# Patient Record
Sex: Female | Born: 1940 | Race: Asian | Hispanic: No | Smoking: Never smoker
Health system: Southern US, Community
[De-identification: ages and names within clinical notes are randomized; demographics above are authoritative.]

## PROBLEM LIST (undated history)

## (undated) DIAGNOSIS — E119 Type 2 diabetes mellitus without complications: Secondary | ICD-10-CM

## (undated) HISTORY — DX: Type 2 diabetes mellitus without complications: E11.9

---

## 2006-11-05 ENCOUNTER — Encounter: Admission: RE | Admit: 2006-11-05 | Discharge: 2006-11-05 | Payer: Self-pay | Admitting: Obstetrics and Gynecology

## 2008-12-03 ENCOUNTER — Emergency Department (HOSPITAL_COMMUNITY): Admission: EM | Admit: 2008-12-03 | Discharge: 2008-12-03 | Payer: Self-pay | Admitting: Family Medicine

## 2009-05-31 ENCOUNTER — Emergency Department (HOSPITAL_COMMUNITY): Admission: EM | Admit: 2009-05-31 | Discharge: 2009-05-31 | Payer: Self-pay | Admitting: Family Medicine

## 2010-05-21 ENCOUNTER — Ambulatory Visit: Payer: Self-pay | Admitting: Internal Medicine

## 2010-05-21 ENCOUNTER — Other Ambulatory Visit: Admission: RE | Admit: 2010-05-21 | Discharge: 2010-05-21 | Payer: Self-pay | Admitting: Internal Medicine

## 2010-09-01 ENCOUNTER — Encounter: Payer: Self-pay | Admitting: Internal Medicine

## 2010-11-15 LAB — POCT I-STAT, CHEM 8
BUN: 12 mg/dL (ref 6–23)
Calcium, Ion: 1.2 mmol/L (ref 1.12–1.32)
Chloride: 105 mEq/L (ref 96–112)
Glucose, Bld: 111 mg/dL — ABNORMAL HIGH (ref 70–99)
HCT: 45 % (ref 36.0–46.0)
Potassium: 3.8 mEq/L (ref 3.5–5.1)

## 2010-11-15 LAB — DIFFERENTIAL
Basophils Absolute: 0 10*3/uL (ref 0.0–0.1)
Basophils Relative: 0 % (ref 0–1)
Lymphocytes Relative: 33 % (ref 12–46)
Monocytes Absolute: 0.5 10*3/uL (ref 0.1–1.0)
Monocytes Relative: 8 % (ref 3–12)
Neutro Abs: 3.9 10*3/uL (ref 1.7–7.7)
Neutrophils Relative %: 57 % (ref 43–77)

## 2010-11-15 LAB — POCT URINALYSIS DIP (DEVICE)
Bilirubin Urine: NEGATIVE
Ketones, ur: NEGATIVE mg/dL
Protein, ur: NEGATIVE mg/dL
Specific Gravity, Urine: 1.01 (ref 1.005–1.030)
pH: 7 (ref 5.0–8.0)

## 2010-11-15 LAB — CBC
HCT: 41.5 % (ref 36.0–46.0)
Hemoglobin: 13.9 g/dL (ref 12.0–15.0)
MCV: 86.6 fL (ref 78.0–100.0)
Platelets: 238 10*3/uL (ref 150–400)
WBC: 6.7 10*3/uL (ref 4.0–10.5)

## 2010-11-21 LAB — POCT URINALYSIS DIP (DEVICE)
Glucose, UA: NEGATIVE mg/dL
Ketones, ur: NEGATIVE mg/dL
Specific Gravity, Urine: 1.02 (ref 1.005–1.030)
Urobilinogen, UA: 0.2 mg/dL (ref 0.0–1.0)

## 2014-04-12 ENCOUNTER — Ambulatory Visit (INDEPENDENT_AMBULATORY_CARE_PROVIDER_SITE_OTHER): Payer: Medicare Other | Admitting: Internal Medicine

## 2014-04-12 ENCOUNTER — Other Ambulatory Visit: Payer: Self-pay | Admitting: Internal Medicine

## 2014-04-12 ENCOUNTER — Encounter: Payer: Self-pay | Admitting: Internal Medicine

## 2014-04-12 VITALS — BP 120/80 | HR 88 | Temp 98.8°F | Ht 60.0 in | Wt 130.0 lb

## 2014-04-12 DIAGNOSIS — Z0000 Encounter for general adult medical examination without abnormal findings: Secondary | ICD-10-CM | POA: Diagnosis not present

## 2014-04-12 DIAGNOSIS — R413 Other amnesia: Secondary | ICD-10-CM | POA: Diagnosis not present

## 2014-04-12 DIAGNOSIS — Z1329 Encounter for screening for other suspected endocrine disorder: Secondary | ICD-10-CM

## 2014-04-12 DIAGNOSIS — Z130 Encounter for screening for diseases of the blood and blood-forming organs and certain disorders involving the immune mechanism: Secondary | ICD-10-CM | POA: Diagnosis not present

## 2014-04-12 DIAGNOSIS — R7301 Impaired fasting glucose: Secondary | ICD-10-CM | POA: Diagnosis not present

## 2014-04-12 LAB — POCT URINALYSIS DIPSTICK
BILIRUBIN UA: NEGATIVE
GLUCOSE UA: NEGATIVE
Ketones, UA: NEGATIVE
Leukocytes, UA: NEGATIVE
NITRITE UA: NEGATIVE
Protein, UA: NEGATIVE
SPEC GRAV UA: 1.01
Urobilinogen, UA: NEGATIVE
pH, UA: 6.5

## 2014-04-13 ENCOUNTER — Telehealth: Payer: Self-pay

## 2014-04-13 LAB — COMPREHENSIVE METABOLIC PANEL
ALBUMIN: 4.1 g/dL (ref 3.5–5.2)
ALK PHOS: 85 U/L (ref 39–117)
ALT: 11 U/L (ref 0–35)
AST: 14 U/L (ref 0–37)
BUN: 18 mg/dL (ref 6–23)
CO2: 26 mEq/L (ref 19–32)
Calcium: 9.3 mg/dL (ref 8.4–10.5)
Chloride: 104 mEq/L (ref 96–112)
Creat: 0.68 mg/dL (ref 0.50–1.10)
Glucose, Bld: 218 mg/dL — ABNORMAL HIGH (ref 70–99)
POTASSIUM: 4.3 meq/L (ref 3.5–5.3)
SODIUM: 140 meq/L (ref 135–145)
TOTAL PROTEIN: 6.8 g/dL (ref 6.0–8.3)
Total Bilirubin: 0.3 mg/dL (ref 0.2–1.2)

## 2014-04-13 LAB — CBC WITH DIFFERENTIAL/PLATELET
BASOS ABS: 0.1 10*3/uL (ref 0.0–0.1)
BASOS PCT: 1 % (ref 0–1)
Eosinophils Absolute: 0.1 10*3/uL (ref 0.0–0.7)
Eosinophils Relative: 2 % (ref 0–5)
HCT: 39.5 % (ref 36.0–46.0)
HEMOGLOBIN: 13 g/dL (ref 12.0–15.0)
Lymphocytes Relative: 30 % (ref 12–46)
Lymphs Abs: 2.1 10*3/uL (ref 0.7–4.0)
MCH: 28.3 pg (ref 26.0–34.0)
MCHC: 32.9 g/dL (ref 30.0–36.0)
MCV: 86.1 fL (ref 78.0–100.0)
Monocytes Absolute: 0.4 10*3/uL (ref 0.1–1.0)
Monocytes Relative: 6 % (ref 3–12)
NEUTROS ABS: 4.3 10*3/uL (ref 1.7–7.7)
NEUTROS PCT: 61 % (ref 43–77)
PLATELETS: 286 10*3/uL (ref 150–400)
RBC: 4.59 MIL/uL (ref 3.87–5.11)
RDW: 14.4 % (ref 11.5–15.5)
WBC: 7 10*3/uL (ref 4.0–10.5)

## 2014-04-13 LAB — HEMOGLOBIN A1C
HEMOGLOBIN A1C: 6.3 % — AB (ref <5.7)
MEAN PLASMA GLUCOSE: 134 mg/dL — AB (ref <117)

## 2014-04-13 LAB — TSH: TSH: 0.998 u[IU]/mL (ref 0.350–4.500)

## 2014-04-13 LAB — VITAMIN B12: Vitamin B-12: 486 pg/mL (ref 211–911)

## 2014-04-13 LAB — FOLATE: Folate: >20 ng/mL

## 2014-04-13 NOTE — Telephone Encounter (Signed)
Per Dr Lenord Fellers patient needs an A1C added to her blood test.  [facility] and added test.

## 2014-04-14 ENCOUNTER — Telehealth: Payer: Self-pay | Admitting: Internal Medicine

## 2014-04-14 MED ORDER — PAROXETINE HCL 10 MG PO TABS: 10.0000 mg | ORAL_TABLET | Freq: Every day | ORAL | Status: DC

## 2014-04-14 NOTE — Patient Instructions (Addendum)
Spoke with Amber Mcgrath, her daughter to inform her that MRI of brain has been scheduled for 04/22/2014 at 8:45 pm at [facility] at [address] She voices understanding. Start Lexapro 10 mg daily

## 2014-04-14 NOTE — Telephone Encounter (Signed)
Paxil 10 mg ordered as antidepressant. Need to get MRI done before ordering memory medication.

## 2014-04-14 NOTE — Progress Notes (Signed)
Patients daughter informed

## 2014-04-22 ENCOUNTER — Ambulatory Visit
Admission: RE | Admit: 2014-04-22 | Discharge: 2014-04-22 | Disposition: A | Discharge status: Home / Self Care | Payer: Medicare Other | Source: Ambulatory Visit | Attending: Internal Medicine | Admitting: Internal Medicine

## 2014-04-22 DIAGNOSIS — R413 Other amnesia: Secondary | ICD-10-CM

## 2014-04-22 DIAGNOSIS — G319 Degenerative disease of nervous system, unspecified: Secondary | ICD-10-CM | POA: Diagnosis not present

## 2014-04-22 MED ORDER — GADOBENATE DIMEGLUMINE 529 MG/ML IV SOLN
12.0000 mL | Freq: Once | INTRAVENOUS | Status: AC | PRN
  Administered 2014-04-22: 12 mL via INTRAVENOUS

## 2014-04-26 ENCOUNTER — Telehealth: Payer: Self-pay

## 2014-04-26 DIAGNOSIS — R413 Other amnesia: Secondary | ICD-10-CM

## 2014-04-26 NOTE — Telephone Encounter (Signed)
Patient daughter informed to have patient take aspirin  daily and follow up with neurologist.  Referral sent to [facility].

## 2014-04-26 NOTE — Telephone Encounter (Signed)
Left message for patients daughter to call office to inform her of imaging results and referral to neuro.

## 2014-04-26 NOTE — Telephone Encounter (Signed)
Message copied by Judd Gaudier on Tue Apr 26, 2014  4:24 PM ------      Message from: Margaree Mackintosh      Created: Sat Apr 23, 2014 12:45 PM       No stroke but moderate to advanced small vessel disease Call daughter and tell her to start one aspirin a day 325 mg. Please arrange for Neuro consult also. ------

## 2014-05-03 ENCOUNTER — Encounter: Payer: Self-pay | Admitting: Neurology

## 2014-05-03 ENCOUNTER — Ambulatory Visit (INDEPENDENT_AMBULATORY_CARE_PROVIDER_SITE_OTHER): Payer: Medicare Other | Admitting: Neurology

## 2014-05-03 VITALS — BP 119/68 | HR 81 | Temp 98.2°F | Ht 60.0 in | Wt 127.0 lb

## 2014-05-03 DIAGNOSIS — F0390 Unspecified dementia without behavioral disturbance: Secondary | ICD-10-CM

## 2014-05-03 DIAGNOSIS — R7309 Other abnormal glucose: Secondary | ICD-10-CM | POA: Diagnosis not present

## 2014-05-03 DIAGNOSIS — R7303 Prediabetes: Secondary | ICD-10-CM

## 2014-05-03 MED ORDER — DONEPEZIL HCL 5 MG PO TABS: 5.0000 mg | ORAL_TABLET | Freq: Every day | ORAL | Status: DC

## 2014-05-03 NOTE — Progress Notes (Signed)
Subjective:    Patient ID: Amber Mcgrath is a 73 y.o. female.  HPI    Huston Foley, MD, PhD [facility] [address], [address] [address] [place], [place] [address]  Dear Dr. Lenord Fellers,   I saw your patient, [name], upon your kind request in my neurologic clinic today for initial consultation of her memory loss. The patient is accompanied by her daughter, Nhung and interpreter, Selena Batten, today. As you know, [name] is a very pleasant 73 year old right-handed woman with an underlying history of depression, borderline diabetes, who has had memory loss for the past 8 months, slowly progressive.  She her brain MRI with and without contrast on 04/22/2014: 1. No acute intracranial infarct or other abnormality identified. 2. 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 In addition, personally reviewed the images through the PACS system. Recent blood work from 04/12/2014 was reviewed: She had a normal B12 at 486, CBC with differential was normal, normal UA, CMP was normal with the exception of elevated blood sugar level at 218, A1c was 6.3, folate normal and TSH normal.  She primarily has been having difficulty with short-term memory such as forgetfulness, misplacing things, asking the same question again and forgetting dates and events. There is no report of confusion or disorientation. Familiar faces are easily recognized.  She reports no recurrent headaches. She does not drive. She does not smoke or drink alcohol.  There is no report of Auditory Hallucinations and Visual Hallucinations and there are no delusions, such as paranoia.  She has not been on any dementia medications.   The patient denies prior TIA or stroke symptoms, such as sudden onset of one sided weakness, numbness, tingling, slurring of speech or droopy face, hearing loss, tinnitus, diplopia or visual field cut or monocular loss of vision, and denies recurrent  headaches.  Of note, the patient denies snoring, and there is no report of witnessed apneas or choking sensations while asleep. She does not drive.   Her Past Medical History Is Significant For: Past Medical History  Diagnosis Date  . Diabetes mellitus without complication     Her Past Surgical History Is Significant For: History reviewed. No pertinent past surgical history.  Her Family History Is Significant For: Family History  Problem Relation Age of Onset  . Depression Father     Her Social History Is Significant For: History   Social History  . Marital Status: Widowed    Spouse Name: N/A    Number of Children: 6  . Years of Education: 12   Occupational History  .      not employed   Social History Main Topics  . Smoking status: Never Smoker   . Smokeless tobacco: Never Used  . Alcohol Use: No  . Drug Use: No  . Sexual Activity: None   Other Topics Concern  . None   Social History Narrative   Patient is right handed and resides with children    Her Allergies Are:  No Known Allergies:   Her Current Medications Are:  Outpatient Encounter Prescriptions as of 05/03/2014  Medication Sig  . aspirin 325 MG tablet Take 325 mg by mouth daily.  . Multiple Vitamin (MULTIVITAMIN) capsule Take 1 capsule by mouth daily.  . Omega-3 Fatty Acids (FISH OIL PO) Take by mouth.  Marland Kitchen PARoxetine (PAXIL) 10 MG tablet Take 1 tablet (10 mg total) by mouth daily.  :  Review of Systems:  Out of a complete 14  point review of systems, all are reviewed and negative with the exception of these symptoms as listed below:   Review of Systems  All other systems reviewed and are negative.   Objective:  Neurologic Exam  Physical Exam Physical Examination:   Filed Vitals:   05/03/14 1010  BP: 119/68  Pulse: 81  Temp: 98.2 F (36.8 C)    General Examination: The patient is a very pleasant 73 y.o. female in no acute distress. She is calm and cooperative with the exam. She denies  Auditory Hallucinations and Visual Hallucinations. She is well groomed and situated in a chair.   HEENT: Normocephalic, atraumatic, pupils are equal, round and reactive to light and accommodation. Funduscopic exam is normal with sharp disc margins noted. Extraocular tracking shows no saccadic breakdown without nystagmus noted. Hearing is intact. Tympanic membranes are clear bilaterally. Face is symmetric with no facial masking and normal facial sensation. There is no lip, neck or jaw tremor. Neck is not rigid with intact passive ROM. There are no carotid bruits on auscultation. Oropharynx exam reveals mild mouth dryness. No significant airway crowding is noted. Mallampati is class III. Tongue protrudes centrally and palate elevates symmetrically.    Chest: is clear to auscultation without wheezing, rhonchi or crackles noted.  Heart: sounds are regular and normal without murmurs, rubs or gallops noted.   Abdomen: is soft, non-tender and non-distended with normal bowel sounds appreciated on auscultation.  Extremities: There is no pitting edema in the distal lower extremities bilaterally. Pedal pulses are intact.   Skin: is warm and dry with no trophic changes noted. Age-related changes are noted on the skin.   Musculoskeletal: exam reveals no obvious joint deformities, tenderness or joint swelling or erythema.   Neurologically:  Mental status: The patient is awake and alert, paying good  attention. She is cannot completely provide the history. Her daughter provides most Hx. She is oriented to: person, place, situation, month of year and year. Her memory, attention, language and knowledge are impaired. There is no aphasia, agnosia, apraxia or anomia. There is a no signficant degree of bradyphrenia. Speech is not hypophonic with no dysarthria noted. Mood is congruent and affect is normal.  Her MMSE (Mini-Mental state exam) score is 20/30.  CDT (Clock Drawing Test) score is 4/4.  AFT (Animal Fluency  Test) score is 6.   Geriatric Depression Scale Score is 4.   Cranial nerves are as described above under HEENT exam. In addition, shoulder shrug is normal with equal shoulder height noted.  Motor exam: Normal bulk, and strength for age is noted. Tone is not rigid with absence of cogwheeling in the extremities. There is overall no bradykinesia. There is no drift or rebound. There is no tremor.  Romberg is negative. Reflexes are 1+ in the upper extremities and 1+ in the lower extremities. Toes are downgoing bilaterally. Fine motor skills: Finger taps, hand movements, and rapid alternating patting are not impaired bilaterally. Foot taps and foot agility are not impaired bilaterally.   Cerebellar testing shows no dysmetria or intention tremor on finger to nose testing. Heel to shin is unremarkable. There is no truncal or gait ataxia.   Sensory exam is intact to light touch, pinprick, vibration, temperature sense and proprioception in the upper and lower extremities.   Gait, station and balance: She stands up from the seated position with no difficulty. No veering to one side is noted. No leaning to one side. Posture is age appropriate. Stance is narrow-based. She turns in 2  steps. Tandem walk is difficutl for her. Balance is very mildly impaired.   Assessment and Plan:   In summary, Aria Jarrard is a very pleasant 72 y.o.-year old female with an underlying history of depression, borderline diabetes, who has had memory loss for the past 8 months, slowly progressive. Her history and physical exam suggest mild dementia without evidence of behavioral changes. Looking at her MRI scan, she may be at risk for vascular dementia rather than Alzheimer's disease. I've asked her to change her daily aspirin to baby aspirin only. She has no prior history of stroke or TIA or heart disease. I would recommend lipid profile since I did not see that in her chart. In addition, talked to the patient and her daughter about  starting low-dose dementia medication, namely Aricept. I would like for her to get started at 5 mg once daily. We talked about potential side effects and I also gave her instructions in writing. I had a long chat with the patient and her daughter about my findings and the diagnosis of memory loss and dementia, its prognosis and treatment options. We talked about medical treatments and non-pharmacological approaches. We talked about maintaining a healthy lifestyle in general and staying active mentally and physically. I encouraged the patient to eat healthy, exercise daily and keep well hydrated, to keep a scheduled bedtime and wake time routine, to not skip any meals and eat healthy snacks in between meals and to have protein with every meal. I stressed the importance of regular exercise, within of course the patient's own mobility limitations. I encouraged the patient to keep up with current events by reading the news paper or watching the news and to do word puzzles, or if feasible, to go on [employer].    I answered all their questions today and the patient and her daughter were in agreement with the above outlined plan. I would like to see the patient back in 3 months, sooner if the need arises and encouraged them to call with any interim questions, concerns, problems, updates and refill requests.   Thank you very much for allowing me to participate in the care of this nice patient. If I can be of any further assistance to you please do not hesitate to call me at [phone].  Sincerely,   Huston Foley, MD, PhD

## 2014-05-03 NOTE — Patient Instructions (Addendum)
You have complaints of memory loss: memory loss or changes in cognitive function can have many reasons and does not always mean you have dementia. Conditions that can contribute to subjective or objective memory loss include: depression, stress, poor sleep from insomnia or sleep apnea, dehydration, fluctuation in blood sugar values, thyroid or electrolyte dysfunction. Dementia can be causes by stroke, brain atherosclerosis and by Alzheimer's disease or other, more rare and sometimes hereditary causes. I will recommend cholesterol check to your primary care doctor. We will start a medication for your memory loss: Aricept (generic name: donepezil) 5 mg: take one pill each evening. Common side effects include dry eyes, dry mouth, confusion, low pulse, low blood pressure and rare side effects include hallucinations.   I do want to suggest a few things today: Take a baby aspirin daily.   Remember to drink plenty of fluid, eat healthy meals and do not skip any meals. Try to eat protein with a every meal and eat a healthy snack such as fruit or nuts in between meals. Try to keep a regular sleep-wake schedule and try to exercise daily, particularly in the form of walking, 20-30 minutes a day, if you can. Good nutrition, proper sleep and exercise can help her cognitive function.  Engage in social activities in your community and with your family and try to keep up with current events by reading the newspaper or watching the news. If you have computer and can go online, try [employer]. Also, you may like to do word finding puzzles or crossword puzzles.  I would like to see you back in 3 months, sooner if we need to. Please call us with any interim questions, concerns, problems, updates or refill requests.

## 2014-05-10 ENCOUNTER — Telehealth: Payer: Self-pay | Admitting: Neurology

## 2014-05-10 DIAGNOSIS — R413 Other amnesia: Secondary | ICD-10-CM | POA: Insufficient documentation

## 2014-05-10 NOTE — Telephone Encounter (Signed)
Patient's daughter calling to state that Medicare won't cover her medication, please return call and advise.

## 2014-05-10 NOTE — Telephone Encounter (Signed)
I called the pharmacy.  They said it's not that her Medicare won't cover the medication (Donepezil), but they need a copy of her Medicare card to bill, a they do not have it on file.  They tried billing Medicaid, and get a rejection saying must bill Medicare first.  I called back, got no answer.  Left message asking that they provide current ins card to pharmacy to bill Medicare.  Asked that they call us back if anything further is needed.

## 2014-05-10 NOTE — Progress Notes (Signed)
   Subjective:    Patient ID: Amber Mcgrath, female    DOB: [birth year], 73 y.o.   MRN: [id number]  HPI   First visit for this [age] year old [place] Female brought in by her daughter,Amber Mcgrath, with complaint of memory issues.  Patient is not speaking much. Daughter speaks some [place]. There is no history of serious illnesses accidents or operations. No known drug allergies.  Patient does not smoke or consume alcohol. Currently living with her son, [name], and his wife.  She has 6 children. She has several grandchildren including a set of twins. 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  Family history: Father died at age 74 of unknown causes. Mother 74 years old. Father was possibly  alcoholic with depression. One brother living. Other siblings deceased. No sisters. Brothers died of war injuries or fall or unknown causes.  Patient's husband has passed away fairly recently  in [place] of complications of a stroke. After his death, family members began to notice issues with her memory.  Daughter reports that she is very forgetful and has little short-term memory.   Review of Systems     Objective:   Physical Exam Skin warm and dry. Nodes none. She is pleasant and cooperative. Communicates with me through her daughter. PERRLA. Funduscopic exam benign. TMs are clear. Neck is supple without JVD thyromegaly or carotid bruits. Chest clear. Breasts normal female cardiac exam regular rate and rhythm normal S1 and S2. Abdomen no hepatosplenomegaly masses or tenderness. Moves all extremities. Muscle strength is normal in upper lower extremities. Gait is normal. She thinks the month is March. She does know her birth date.       Assessment & Plan:  Memory loss. Could be related to depression in addition to an early dementia. Could be having a grief reaction from loss of husband.  Plan: We will start her on low-dose antidepressant Lexapro 10 mg daily for possible depression. We are  going to consider starting her on Aricept 5 mg daily. Like to see her again in 3 weeks. Labs work is done today including CBC complete metabolic panel TSH, B12 and folate levels. Would like for her to have MRI of the brain with contrast soon. She likely need neurology consultation.

## 2014-05-17 ENCOUNTER — Other Ambulatory Visit (INDEPENDENT_AMBULATORY_CARE_PROVIDER_SITE_OTHER): Payer: Medicare Other | Admitting: Internal Medicine

## 2014-05-17 DIAGNOSIS — Z23 Encounter for immunization: Secondary | ICD-10-CM | POA: Diagnosis not present

## 2014-05-17 DIAGNOSIS — R413 Other amnesia: Secondary | ICD-10-CM

## 2014-05-17 DIAGNOSIS — R7309 Other abnormal glucose: Secondary | ICD-10-CM

## 2014-05-17 DIAGNOSIS — E785 Hyperlipidemia, unspecified: Secondary | ICD-10-CM | POA: Diagnosis not present

## 2014-05-17 LAB — HEPATIC FUNCTION PANEL
ALK PHOS: 74 U/L (ref 39–117)
ALT: 9 U/L (ref 0–35)
AST: 15 U/L (ref 0–37)
Albumin: 4.3 g/dL (ref 3.5–5.2)
BILIRUBIN DIRECT: 0.1 mg/dL (ref 0.0–0.3)
Indirect Bilirubin: 0.6 mg/dL (ref 0.2–1.2)
Total Bilirubin: 0.7 mg/dL (ref 0.2–1.2)
Total Protein: 7.1 g/dL (ref 6.0–8.3)

## 2014-05-17 LAB — LIPID PANEL
CHOL/HDL RATIO: 3.2 ratio
CHOLESTEROL: 179 mg/dL (ref 0–200)
HDL: 56 mg/dL (ref >39)
LDL Cholesterol: 110 mg/dL — ABNORMAL HIGH (ref 0–99)
Triglycerides: 65 mg/dL (ref <150)
VLDL: 13 mg/dL (ref 0–40)

## 2014-05-17 NOTE — Progress Notes (Addendum)
   Subjective:    Patient ID: Amber Mcgrath, female    DOB: 04-17-1941, 73 y.o.   MRN: [id number]  HPI  daughter stopped me in hall today while her mother was here for lab work and asked for samples of Aricept. Says it is costing out of pocket $180. Medicare card has not been received yet. Says memory is no better. Says depression is better with antidepressant. Says she is not working and having to watch her mother. She did see neurologist recently. She may try to call neurologist to see if he has samples. He prescribed Aricept 5 mg daily.    Review of Systems     Objective:   Physical Exam        Assessment & Plan:

## 2014-05-18 ENCOUNTER — Telehealth: Payer: Self-pay | Admitting: Neurology

## 2014-05-18 NOTE — Telephone Encounter (Signed)
Daughter, Amber Mcgrath, stopped by the office to state that her mother is waiting for her Medicare card and is not able to get prescriptions.  In the meantime, they cannot afford to pay out-of-pocket for the medication and are requesting samples or less expensive medication. Best number to call back is [phone]. It is okay to leave a message at this number.

## 2014-05-20 NOTE — Telephone Encounter (Signed)
Message was just forwarded to me today by nurse.  It appears the only medication we prescribe is generic Aricept.  Unfortunately, this medication is not sampled.  I went online to [employer] and found for 30 day supply, the cost of this medication would be about $8 without ins.  I called back.  Got no answer.  Left message with this info, and the website for them to view as well.  Asked them to call us back if anything further was needed.

## 2014-08-02 ENCOUNTER — Ambulatory Visit: Payer: Medicare Other | Admitting: Nurse Practitioner

## 2014-08-29 ENCOUNTER — Encounter: Payer: Self-pay | Admitting: Neurology

## 2014-08-29 ENCOUNTER — Ambulatory Visit (INDEPENDENT_AMBULATORY_CARE_PROVIDER_SITE_OTHER): Payer: Medicare Other | Admitting: Neurology

## 2014-08-29 VITALS — BP 136/81 | HR 70 | Temp 98.1°F | Wt 132.0 lb

## 2014-08-29 DIAGNOSIS — R7309 Other abnormal glucose: Secondary | ICD-10-CM | POA: Diagnosis not present

## 2014-08-29 DIAGNOSIS — F0390 Unspecified dementia without behavioral disturbance: Secondary | ICD-10-CM

## 2014-08-29 DIAGNOSIS — F329 Major depressive disorder, single episode, unspecified: Secondary | ICD-10-CM

## 2014-08-29 DIAGNOSIS — F32A Depression, unspecified: Secondary | ICD-10-CM

## 2014-08-29 DIAGNOSIS — R7303 Prediabetes: Secondary | ICD-10-CM

## 2014-08-29 MED ORDER — DONEPEZIL HCL 10 MG PO TABS
10.0000 mg | ORAL_TABLET | Freq: Every day | ORAL | Status: DC
Start: 2014-08-29 — End: 2015-08-01

## 2014-08-29 NOTE — Progress Notes (Signed)
Subjective:    Patient ID: Amber Mcgrath is a 74 y.o. female.  HPI     Interim history:   Amber Mcgrath is a very pleasant 74 year old right-handed woman with an underlying history of depression, borderline diabetes, who presents for follow-up consultation of her memory loss. The patient is accompanied by her second youngest daughter, Amber Mcgrath") and an interpreter, "Amber Mcgrath" today. I first met her on 05/03/2014 at the request of her primary care physician, at which time her daughter reported an approximately eight-month history of progressive memory loss. At the time of her first visit her MMSE was 20 out of 30. I suggested low-dose generic Aricept at 5 mg daily. She has previously had appropriate blood work and an MRI so I did not add any new test. I suggested she take a baby aspirin as opposed to an adult size aspirin daily as she did not have any history of stroke or TIA previously. She also had no history of heart disease.   Today, she reports doing well. She has no acute complaints. She has had no changes in medications. Her daughter reports that she has not noticed much in the way of change in the memory loss but she seems to tolerate Aricept at 5 mg strength without any side effects reported. She is not very active physically and mentally. They are planning to send her to [place] for approximately 2 or 3 months visit if possible. She has family there, including her oldest child. She has extended family and friends there is well which may be a good way of interacting and socializing for her. She does not always drink enough water.  She had a brain MRI with and without contrast on 04/22/2014: 1. No acute intracranial infarct or other abnormality identified. 2. 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 In addition, personally reviewed the images through the PACS system. Blood work from 04/12/2014 showed: normal B12 at 486, CBC with differential was  normal, normal UA, CMP was normal with the exception of elevated blood sugar level at 218, A1c was 6.3, folate normal and TSH normal.  She primarily has been having difficulty with short-term memory such as forgetfulness, misplacing things, asking the same question again and forgetting dates and events. There is no report of confusion or disorientation. Familiar faces are easily recognized.  She reports no recurrent headaches. She does not drive. She does not smoke or drink alcohol.   There is no report of Auditory Hallucinations and Visual Hallucinations and there are no delusions, such as paranoia.   She has not been on any dementia medications.    The patient denies prior TIA or stroke symptoms, such as sudden onset of one sided weakness, numbness, tingling, slurring of speech or droopy face, hearing loss, tinnitus, diplopia or visual field cut or monocular loss of vision, and denies recurrent headaches.  Of note, the patient denies snoring, and there is no report of witnessed apneas or choking sensations while asleep. She does not drive.   Her Past Medical History Is Significant For: Past Medical History  Diagnosis Date  . Diabetes mellitus without complication     Her Past Surgical History Is Significant For: History reviewed. No pertinent past surgical history.  Her Family History Is Significant For: Family History  Problem Relation Age of Onset  . Depression Father     Her Social History Is Significant For: History   Social History  . Marital Status: Widowed    Spouse Name:  N/A    Number of Children: 6  . Years of Education: 12   Occupational History  .      not employed   Social History Main Topics  . Smoking status: Never Smoker   . Smokeless tobacco: Never Used  . Alcohol Use: No  . Drug Use: No  . Sexual Activity: None   Other Topics Concern  . None   Social History Narrative   Patient is right handed and resides with children    Her Allergies Are:  No  Known Allergies:   Her Current Medications Are:  Outpatient Encounter Prescriptions as of 08/29/2014  Medication Sig  . aspirin 325 MG tablet Take 325 mg by mouth daily.  Marland Kitchen donepezil (ARICEPT) 5 MG tablet Take 1 tablet (5 mg total) by mouth at bedtime.  . Multiple Vitamin (MULTIVITAMIN) capsule Take 1 capsule by mouth daily.  . Omega-3 Fatty Acids (FISH OIL PO) Take by mouth.  Marland Kitchen PARoxetine (PAXIL) 10 MG tablet Take 1 tablet (10 mg total) by mouth daily.  :  Review of Systems:  Out of a complete 14 point review of systems, all are reviewed and negative with the exception of these symptoms as listed below:   Review of Systems  Musculoskeletal: Positive for gait problem.  Neurological:       Memory    Objective:  Neurologic Exam  Physical Exam Physical Examination:   Filed Vitals:   08/29/14 1304  BP: 136/81  Pulse: 70  Temp: 98.1 F (36.7 C)    General Examination: The patient is a very pleasant 74 y.o. female in no acute distress. She is calm and cooperative with the exam. She denies Auditory Hallucinations and Visual Hallucinations. She is well groomed and situated in a chair.   HEENT: Normocephalic, atraumatic, pupils are equal, round and reactive to light and accommodation. Funduscopic exam is normal with sharp disc margins noted. Extraocular tracking shows no saccadic breakdown without nystagmus noted. Hearing is intact. Face is symmetric with no facial masking and normal facial sensation. There is no lip, neck or jaw tremor. Neck is not rigid with intact passive ROM. There are no carotid bruits on auscultation. Oropharynx exam reveals mild mouth dryness. No significant airway crowding is noted. Mallampati is class III. Tongue protrudes centrally and palate elevates symmetrically.    Chest: is clear to auscultation without wheezing, rhonchi or crackles noted.  Heart: sounds are regular and normal without murmurs, rubs or gallops noted.   Abdomen: is soft, non-tender and  non-distended with normal bowel sounds appreciated on auscultation.  Extremities: There is no pitting edema in the distal lower extremities bilaterally. Pedal pulses are intact.   Skin: is warm and dry with no trophic changes noted. Age-related changes are noted on the skin.   Musculoskeletal: exam reveals no obvious joint deformities, tenderness or joint swelling or erythema.   Neurologically:  Mental status: The patient is awake and alert, paying good  attention. She is cannot completely provide the history. Her daughter provides most Hx. She is oriented to: person, place, situation, month of year and year. Her memory, attention, language and knowledge are impaired. There is no aphasia, agnosia, apraxia or anomia. There is a no signficant degree of bradyphrenia. Speech is not hypophonic with no dysarthria noted. Mood is congruent and affect is norgmal.  On 05/03/14: Her MMSE (Mini-Mental state exam): 20/30, CDT (Clock Drawing Test): 4/4 and AFT: 6/min. Geriatric Depression Scale Score: 4/15.   Cranial nerves are as described above under  HEENT exam. In addition, shoulder shrug is normal with equal shoulder height noted.  Motor exam: Normal bulk, and strength for age is noted. Tone is not rigid with absence of cogwheeling in the extremities. There is overall no bradykinesia. There is no drift or rebound. There is no tremor.  Romberg is negative. Reflexes are 1+ in the upper extremities and 1+ in the lower extremities. Toes are downgoing bilaterally. Fine motor skills: Finger taps, hand movements, and rapid alternating patting are not impaired bilaterally. Foot taps and foot agility are not impaired bilaterally.   Cerebellar testing shows no dysmetria or intention tremor on finger to nose testing. Heel to shin is unremarkable. There is no truncal or gait ataxia.   Sensory exam is intact to light touch, pinprick, vibration, temperature sense in the upper and lower extremities.   Gait, station and  balance: She stands up from the seated position with no difficulty. No veering to one side is noted. No leaning to one side. Posture is age appropriate. Stance is narrow-based. She turns in 2 steps. Balance is very mildly impaired.   Assessment and Plan:   In summary, Sky Primo is a very pleasant 74 year old female with an underlying history of depression, and borderline diabetes, who has had memory loss for the past year, slowly progressive. Her history and physical exam suggest mild dementia without evidence of behavioral changes. She had a brain MRI and labs which we discussed last time. She has been on donepezil 5 mg strength once daily with good tolerance. She is encouraged to drink more water. She is encouraged to try to stay active mentally and physically. She is planning on visiting her family and [place] for an extended visit which I think will be good for her in terms of socializing and interaction with old friends and family. I would like to get her on donepezil 10 mg once daily and I provided a 90 day prescription so it will last her through her visit away. I will see her in about 3 or 4 months when she is back from [place].  I again had a long chat with the patient and her daughter about my findings and the diagnosis of memory loss and dementia, its prognosis and treatment options. We talked about medical treatments and non-pharmacological approaches. We talked about maintaining a healthy lifestyle in general and staying active mentally and physically. I encouraged the patient to eat healthy, exercise daily and keep well hydrated, to keep a scheduled bedtime and wake time routine, to not skip any meals and eat healthy snacks in between meals and to have protein with every meal. I stressed the importance of regular exercise, within of course the patient's own mobility limitations. I encouraged the patient to keep up with current events by reading the news paper or watching the news and to do word  puzzles, if possible.     I answered all their questions today and the patient and her daughter were in agreement with the above outlined plan. I encouraged Stanton Mcgrath to call with any interim questions, concerns, problems, updates and refill requests.

## 2014-08-29 NOTE — Patient Instructions (Signed)
We will increase your donepezil to 10 mg and I will see you back in 3-4 months.   Please try to walk regularly.   Please drink more water.

## 2014-09-07 ENCOUNTER — Telehealth: Payer: Self-pay | Admitting: Internal Medicine

## 2014-09-07 NOTE — Telephone Encounter (Signed)
Patient's daughter, [name] is calling to get a refill on patient's Paxil 10 mg.  Patient is leaving to go out of the country on Monday, 09/12/14.  She will be gone for 3 months.  She only needs refill on the Paxil, nothing else.    Pharmacy:  [facility] (Last time used [facility] [address])

## 2014-09-07 NOTE — Telephone Encounter (Signed)
Please call [facility] and tell them she is going to [place] for 3 months and needs 90 day supply of generic Paxil.

## 2014-09-08 ENCOUNTER — Other Ambulatory Visit: Payer: Self-pay | Admitting: *Deleted

## 2014-09-08 MED ORDER — PAROXETINE HCL 10 MG PO TABS: 10.0000 mg | ORAL_TABLET | Freq: Every day | ORAL | Status: DC

## 2014-09-08 NOTE — Telephone Encounter (Signed)
[facility]

## 2014-11-01 ENCOUNTER — Ambulatory Visit: Payer: Medicare Other | Admitting: Neurology

## 2014-12-28 ENCOUNTER — Telehealth: Payer: Self-pay | Admitting: Neurology

## 2014-12-28 ENCOUNTER — Ambulatory Visit: Payer: Self-pay | Admitting: Neurology

## 2014-12-28 NOTE — Telephone Encounter (Signed)
Patient no showed for an appointment today, 12/28/2014, at 1100.

## 2014-12-29 ENCOUNTER — Encounter: Payer: Self-pay | Admitting: Neurology

## 2015-07-27 ENCOUNTER — Ambulatory Visit: Payer: Medicare Other | Admitting: Neurology

## 2015-07-27 DIAGNOSIS — R030 Elevated blood-pressure reading, without diagnosis of hypertension: Secondary | ICD-10-CM | POA: Diagnosis not present

## 2015-07-27 DIAGNOSIS — S8391XA Sprain of unspecified site of right knee, initial encounter: Secondary | ICD-10-CM | POA: Diagnosis not present

## 2015-07-27 DIAGNOSIS — Z23 Encounter for immunization: Secondary | ICD-10-CM | POA: Diagnosis not present

## 2015-07-27 DIAGNOSIS — S0990XA Unspecified injury of head, initial encounter: Secondary | ICD-10-CM | POA: Diagnosis not present

## 2015-07-27 DIAGNOSIS — S43402A Unspecified sprain of left shoulder joint, initial encounter: Secondary | ICD-10-CM | POA: Diagnosis not present

## 2015-07-27 DIAGNOSIS — S00212A Abrasion of left eyelid and periocular area, initial encounter: Secondary | ICD-10-CM | POA: Diagnosis not present

## 2015-08-01 ENCOUNTER — Ambulatory Visit (INDEPENDENT_AMBULATORY_CARE_PROVIDER_SITE_OTHER): Payer: Medicare Other | Admitting: Neurology

## 2015-08-01 ENCOUNTER — Encounter: Payer: Self-pay | Admitting: Neurology

## 2015-08-01 VITALS — BP 142/90 | HR 72 | Resp 14 | Ht 60.0 in | Wt 129.0 lb

## 2015-08-01 DIAGNOSIS — F0280 Dementia in other diseases classified elsewhere without behavioral disturbance: Secondary | ICD-10-CM

## 2015-08-01 DIAGNOSIS — G301 Alzheimer's disease with late onset: Secondary | ICD-10-CM

## 2015-08-01 DIAGNOSIS — F0390 Unspecified dementia without behavioral disturbance: Secondary | ICD-10-CM | POA: Diagnosis not present

## 2015-08-01 MED ORDER — MEMANTINE HCL ER 7 MG PO CP24: 7.0000 mg | ORAL_CAPSULE | Freq: Every day | ORAL | Status: DC

## 2015-08-01 MED ORDER — DONEPEZIL HCL 10 MG PO TABS
10.0000 mg | ORAL_TABLET | Freq: Every day | ORAL | Status: DC
Start: 2015-08-01 — End: 2015-11-22

## 2015-08-01 NOTE — Patient Instructions (Addendum)
We will continue with donepezil 10 mg daily.   I would like to suggest starting you on a second memory medication, Namenda XR, starting at 7 mg once daily with gradual build up. Side effects include: nausea, confusion, hallucination, personality changes. If you are having mild side effects, try to stick with the treatment as these initial side effects may go away after the first 10-14 days.     We will fill out paperwork for your citizenship when you bring it.    We will fill out a handicap form for your car.

## 2015-08-01 NOTE — Progress Notes (Signed)
Subjective:    Patient ID: Amber Mcgrath is a 74 y.o. female.  HPI     Interim history:  Amber Mcgrath is a very pleasant 74 year old right-handed woman with an underlying history of depression, borderline diabetes, who presents for follow-up consultation of her memory loss. The patient is accompanied by her second youngest daughter, Amber Mcgrath") today. Of note, the patient no showed for an appointment on 12/28/2014. I last saw her on 08/29/2014, at which time the patient reported doing well. She had no new complaints. Her daughter reported that she was able to tolerate Aricept at 5 mg strength without any side effects noted. They were planning to have her go to [place] for about 2-3 months so she could be with extended family and friends. I suggested we increase the Aricept to 10 mg once daily. She was encouraged to drink more water.  Today, 08/01/2015: She reports feeling okay, but she cannot speak enough [place]. Her daughter had to move for her in with her. She could not live alone anymore. Unfortunately, from what I understand, her 4 sons are not pitching in and she is primarily staying with her daughter and her family at this time. Daughter reports that she has to work on and off to bring in additional money. She works at a [employer]. Her husband works in the same profession but he has to travel back and forth to [place] as I understand. Unfortunately, the patient felt 5 days ago and bruised her left face. She did not hit her head per se and did not have any loss of consciousness but did bump her left shoulder and has some soreness in her left shoulder and sustained a black eye on the left and a facial laceration on the left. They did not seek medical attention at the time. She does have follow-up appointment with her primary care physician. She is no longer able to walk more than 100 feet or so. When the daughter drops her off at the front door of a store, she has a tendency to wander off while the  daughter Amber Mcgrath the car. The daughter is requesting a handicap sticker. She is also going to work on her mother's citizenship paperwork and will request information from my end of things regarding her memory loss. I would be happy to provide any information that as needed. In addition, the daughter is asking how she can go about being the spokesperson for her mother. I talked her about power of attorney. She is advised to seek an attorney for further advice. The patient is not able to handle her finances at this time. She continues to take generic donepezil 10 mg daily. Her memory has become worse. She is confused at times and gets disoriented especially at night, wandering the house. The daughter keeps the bathroom light on at night to help her mother with direction to the bathroom but still she often does not find the bathroom and has wet herself at night. She would not wear depends.   Previously:   I first met her on 05/03/2014 at the request of her primary care physician, at which time her daughter reported an approximately eight-month history of progressive memory loss. At the time of her first visit her MMSE was 20 out of 30. I suggested low-dose generic Aricept at 5 mg daily. She has previously had appropriate blood work and an MRI so I did not add any new test. I suggested she take a baby aspirin as opposed to an  adult size aspirin daily as she did not have any history of stroke or TIA previously. She also had no history of heart disease.   She had a brain MRI with and without contrast on 04/22/2014: 1. No acute intracranial infarct or other abnormality identified. 2. 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 In addition, personally reviewed the images through the PACS system. Blood work from 04/12/2014 showed: normal B12 at 486, CBC with differential was normal, normal UA, CMP was normal with the exception of elevated blood sugar level at 218, A1c  was 6.3, folate normal and TSH normal.  She primarily has been having difficulty with short-term memory such as forgetfulness, misplacing things, asking the same question again and forgetting dates and events. There is no report of confusion or disorientation. Familiar faces are easily recognized.  She reports no recurrent headaches. She does not drive. She does not smoke or drink alcohol.   There is no report of Auditory Hallucinations and Visual Hallucinations and there are no delusions, such as paranoia.   She has not been on any dementia medications.    The patient denies prior TIA or stroke symptoms, such as sudden onset of one sided weakness, numbness, tingling, slurring of speech or droopy face, hearing loss, tinnitus, diplopia or visual field cut or monocular loss of vision, and denies recurrent headaches.   Of note, the patient denies snoring, and there is no report of witnessed apneas or choking sensations while asleep. She does not drive.   Her Past Medical History Is Significant For: Past Medical History  Diagnosis Date  . Diabetes mellitus without complication ([place])     Her Past Surgical History Is Significant For: No past surgical history on file.  Her Family History Is Significant For: Family History  Problem Relation Age of Onset  . Depression Father     Her Social History Is Significant For: Social History   Social History  . Marital Status: Widowed    Spouse Name: N/A  . Number of Children: 6  . Years of Education: 12   Occupational History  .      not employed   Social History Main Topics  . Smoking status: Never Smoker   . Smokeless tobacco: Never Used  . Alcohol Use: No  . Drug Use: No  . Sexual Activity: Not Asked   Other Topics Concern  . None   Social History Narrative   Patient is right handed and resides with children    Her Allergies Are:  No Known Allergies:   Her Current Medications Are:  Outpatient Encounter Prescriptions as of  08/01/2015  Medication Sig  . aspirin 325 MG tablet Take 325 mg by mouth daily.  Marland Kitchen donepezil (ARICEPT) 10 MG tablet Take 1 tablet (10 mg total) by mouth at bedtime.  . Multiple Vitamin (MULTIVITAMIN) capsule Take 1 capsule by mouth daily.  . Omega-3 Fatty Acids (FISH OIL PO) Take by mouth.  Marland Kitchen PARoxetine (PAXIL) 10 MG tablet Take 1 tablet (10 mg total) by mouth daily.   No facility-administered encounter medications on file as of 08/01/2015.  :  Review of Systems:  Out of a complete 14 point review of systems, all are reviewed and negative with the exception of these symptoms as listed below:   Review of Systems  Neurological:       Daughter reports that patient is not doing well and seems more confused. Daughter states that patient forgot where the bathroom was in  their house.  Patient has had some recent falls, current injury to L eye.     Objective:  Neurologic Exam  Physical Exam Physical Examination:   Filed Vitals:   08/01/15 0827  BP: 142/90  Pulse: 72  Resp: 14    General Examination: The patient is a very pleasant 74 y.o. female in no acute distress. She is calm and cooperative with the exam. She denies Auditory Hallucinations and Visual Hallucinations. She is well groomed and situated in a chair. She is minimally verbal.  HEENT: Normocephalic, atraumatic, pupils are equal, round and reactive to light and accommodation. Extraocular tracking shows no saccadic breakdown without nystagmus noted. Hearing is intact. Face is symmetric with no facial masking and normal facial sensation. She has remnants of a left sided black eye. She has a healing scar under the left eye with evidence of laceration, about half an inch long. Wound does not look irritated or oozing. There is no lip, neck or jaw tremor. Neck is not rigid with intact passive ROM. There are no carotid bruits on auscultation. Oropharynx exam reveals mild mouth dryness. No significant airway crowding is noted. Mallampati  is class III. Tongue protrudes centrally and palate elevates symmetrically.    Chest: is clear to auscultation without wheezing, rhonchi or crackles noted.  Heart: sounds are regular and normal without murmurs, rubs or gallops noted.   Abdomen: is soft, non-tender and non-distended with normal bowel sounds appreciated on auscultation.  Extremities: There is no pitting edema in the distal lower extremities bilaterally. She reports left shoulder pain but has full range of motion. She reports soreness of her left upper arm but there is no bruise. There is no swelling.  Skin: is warm and dry with no trophic changes noted. Age-related changes are noted on the skin.   Musculoskeletal: exam reveals no obvious joint deformities, tenderness or joint swelling or erythema.   Neurologically:  Mental status: The patient is awake and alert, paying good  attention. She is cannot provide the history. Her daughter provides her Hx. She is oriented to: person. Her memory, attention, language and knowledge are impaired. There is no aphasia, agnosia, apraxia or anomia. There is a no signficant degree of bradyphrenia. Speech is not hypophonic with no dysarthria noted. Mood is congruent and affect is blunted.   On 05/03/14: Her MMSE (Mini-Mental state exam): 20/30, CDT (Clock Drawing Test): 4/4 and AFT: 6/min. Geriatric Depression Scale Score: 4/15.   On 08/01/2015: MMSE: 13/30, CDT: 2/4, AFT: 3/min.  Cranial nerves are as described above under HEENT exam. In addition, shoulder shrug is normal with equal shoulder height noted.  Motor exam: Normal bulk, and strength for age is noted. Tone is not rigid with absence of cogwheeling in the extremities. There is overall no bradykinesia. There is no drift or rebound. There is no tremor.  Romberg is negative. Reflexes are 1+ in the upper extremities and 1+ in the lower extremities. Fine motor skills: Finger taps, hand movements, and rapid alternating patting are not  impaired bilaterally. Foot taps and foot agility are not impaired bilaterally.   Cerebellar testing shows no dysmetria or intention tremor on finger to nose testing. Heel to shin is unremarkable. There is no truncal or gait ataxia.   Sensory exam is intact to light touch in the upper and lower extremities.   Gait, station and balance: She stands up from the seated position with no difficulty. No veering to one side is noted. No leaning to one side. Posture is  age appropriate. Stance is narrow-based. She turns in 2 steps. Balance is very mildly impaired.   Assessment and Plan:   In summary, Amber Mcgrath is a very pleasant 74 year old female with an underlying history of depression, and borderline diabetes, who has had memory loss for the past 2 years, progressive. 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 She had a brain MRI and labs which we  have previously discussed. She has been on donepezil  10 mg once daily with good tolerance. Her memory scores have declined. I have not seen her in almost a year at this time. I would like to start her on a second memory medication, Namenda long-acting 7 mg once daily. I filled out a handicap sticker for her. I appreciate that her daughter has moved her in with her for safety and supervision. She requires more supervision at this time. They are going to seek citizenship for the patient. I would be happy to fill out any paperwork on her behalf. Furthermore, the daughter is advised to seek power of attorney so she can handle her mother's health and financial affairs as well. I suggested a 3 month follow-up for memory checked. We will hopefully be able to increase her Namenda if no new medication as tolerated. I provided written instructions including potential side effects that need to be monitored. In addition, I renewed the prescription for generic Aricept 10 mg once daily. I answered  all her daughter's questions today and the patient and her daughter were in agreement.  I spent 25 minutes in total face-to-face time with the patient, more than 50% of which was spent in counseling and coordination of care, reviewing test results, reviewing medication and discussing or reviewing the diagnosis of AD, its prognosis and treatment options.

## 2015-08-16 ENCOUNTER — Telehealth: Payer: Self-pay | Admitting: Neurology

## 2015-08-16 MED ORDER — MEMANTINE HCL 10 MG PO TABS: ORAL_TABLET | ORAL | Status: DC

## 2015-08-16 NOTE — Telephone Encounter (Signed)
Pt's daughter called said memantine (NAMENDA XR) 7 MG CP24 24 hr capsule brand is too expensive. She is requesting generic. Pharmacy told her [facility] would have coupon pt could use. Please call and advise

## 2015-08-16 NOTE — Telephone Encounter (Signed)
Rx has been updated and sent.  I called back.  Got no answer.  Left message.

## 2015-08-16 NOTE — Telephone Encounter (Signed)
I called back.  Answered all questions.  They expressed understanding and appreciation, and will call us back if anything further is needed.

## 2015-08-16 NOTE — Telephone Encounter (Signed)
error 

## 2015-08-16 NOTE — Telephone Encounter (Signed)
Mary returned Jessica's call, please call [phone].

## 2015-08-16 NOTE — Telephone Encounter (Signed)
Okay to change to generic Namenda 10 mg strength, 1 pill once daily for 2 weeks, then 1 pill twice daily thereafter. #60 with 5 refills.

## 2015-08-17 ENCOUNTER — Telehealth: Payer: Self-pay | Admitting: Neurology

## 2015-08-17 NOTE — Telephone Encounter (Signed)
Pt's daughter called very confused about what Amber Mcgrath is telling her. She said she is going to [facility] to talk with them to find out what they are talking about and if she needs us she will call back in the morning.

## 2015-08-21 ENCOUNTER — Other Ambulatory Visit: Payer: Medicare Other | Admitting: Internal Medicine

## 2015-08-22 ENCOUNTER — Ambulatory Visit (INDEPENDENT_AMBULATORY_CARE_PROVIDER_SITE_OTHER): Payer: Medicare Other | Admitting: Internal Medicine

## 2015-08-22 ENCOUNTER — Encounter: Payer: Self-pay | Admitting: Internal Medicine

## 2015-08-22 VITALS — BP 116/80 | HR 78 | Temp 97.9°F | Resp 20 | Ht 60.0 in | Wt 126.0 lb

## 2015-08-22 DIAGNOSIS — F329 Major depressive disorder, single episode, unspecified: Secondary | ICD-10-CM | POA: Diagnosis not present

## 2015-08-22 DIAGNOSIS — R413 Other amnesia: Secondary | ICD-10-CM | POA: Diagnosis not present

## 2015-08-22 DIAGNOSIS — G4700 Insomnia, unspecified: Secondary | ICD-10-CM

## 2015-08-22 DIAGNOSIS — Z9181 History of falling: Secondary | ICD-10-CM

## 2015-08-22 DIAGNOSIS — Z1321 Encounter for screening for nutritional disorder: Secondary | ICD-10-CM | POA: Diagnosis not present

## 2015-08-22 DIAGNOSIS — Z130 Encounter for screening for diseases of the blood and blood-forming organs and certain disorders involving the immune mechanism: Secondary | ICD-10-CM | POA: Diagnosis not present

## 2015-08-22 DIAGNOSIS — Z1329 Encounter for screening for other suspected endocrine disorder: Secondary | ICD-10-CM

## 2015-08-22 DIAGNOSIS — Z23 Encounter for immunization: Secondary | ICD-10-CM | POA: Diagnosis not present

## 2015-08-22 DIAGNOSIS — Z13220 Encounter for screening for lipoid disorders: Secondary | ICD-10-CM | POA: Diagnosis not present

## 2015-08-22 DIAGNOSIS — F32A Depression, unspecified: Secondary | ICD-10-CM

## 2015-08-22 DIAGNOSIS — Z79899 Other long term (current) drug therapy: Secondary | ICD-10-CM | POA: Diagnosis not present

## 2015-08-22 DIAGNOSIS — M8580 Other specified disorders of bone density and structure, unspecified site: Secondary | ICD-10-CM | POA: Diagnosis not present

## 2015-08-22 DIAGNOSIS — Z0000 Encounter for general adult medical examination without abnormal findings: Secondary | ICD-10-CM

## 2015-08-22 DIAGNOSIS — E559 Vitamin D deficiency, unspecified: Secondary | ICD-10-CM | POA: Diagnosis not present

## 2015-08-22 LAB — CBC WITH DIFFERENTIAL/PLATELET
BASOS PCT: 1 % (ref 0–1)
Basophils Absolute: 0.1 10*3/uL (ref 0.0–0.1)
EOS PCT: 1 % (ref 0–5)
Eosinophils Absolute: 0.1 10*3/uL (ref 0.0–0.7)
HEMATOCRIT: 40.9 % (ref 36.0–46.0)
HEMOGLOBIN: 13.9 g/dL (ref 12.0–15.0)
Lymphocytes Relative: 27 % (ref 12–46)
Lymphs Abs: 2.2 10*3/uL (ref 0.7–4.0)
MCH: 28.7 pg (ref 26.0–34.0)
MCHC: 34 g/dL (ref 30.0–36.0)
MCV: 84.5 fL (ref 78.0–100.0)
MONO ABS: 0.7 10*3/uL (ref 0.1–1.0)
MONOS PCT: 8 % (ref 3–12)
MPV: 9.8 fL (ref 8.6–12.4)
Neutro Abs: 5.2 10*3/uL (ref 1.7–7.7)
Neutrophils Relative %: 63 % (ref 43–77)
Platelets: 270 10*3/uL (ref 150–400)
RBC: 4.84 MIL/uL (ref 3.87–5.11)
RDW: 13.9 % (ref 11.5–15.5)
WBC: 8.2 10*3/uL (ref 4.0–10.5)

## 2015-08-22 LAB — COMPLETE METABOLIC PANEL WITH GFR
ALBUMIN: 4 g/dL (ref 3.6–5.1)
ALK PHOS: 99 U/L (ref 33–130)
ALT: 12 U/L (ref 6–29)
AST: 14 U/L (ref 10–35)
BUN: 14 mg/dL (ref 7–25)
CALCIUM: 9.6 mg/dL (ref 8.6–10.4)
CHLORIDE: 102 mmol/L (ref 98–110)
CO2: 26 mmol/L (ref 20–31)
Creat: 0.72 mg/dL (ref 0.60–0.93)
GFR, EST NON AFRICAN AMERICAN: 82 mL/min (ref >=60)
GFR, Est African American: >89 mL/min (ref >=60)
Glucose, Bld: 104 mg/dL — ABNORMAL HIGH (ref 65–99)
POTASSIUM: 4.1 mmol/L (ref 3.5–5.3)
SODIUM: 140 mmol/L (ref 135–146)
Total Bilirubin: 0.7 mg/dL (ref 0.2–1.2)
Total Protein: 7 g/dL (ref 6.1–8.1)

## 2015-08-22 LAB — POCT URINALYSIS DIPSTICK
BILIRUBIN UA: NEGATIVE
Blood, UA: NEGATIVE
GLUCOSE UA: NEGATIVE
KETONES UA: NEGATIVE
LEUKOCYTES UA: NEGATIVE
NITRITE UA: NEGATIVE
Protein, UA: NEGATIVE
Spec Grav, UA: 1.01
Urobilinogen, UA: 0.2
pH, UA: 7.5

## 2015-08-22 LAB — TSH: TSH: 0.823 u[IU]/mL (ref 0.350–4.500)

## 2015-08-22 LAB — LIPID PANEL
CHOL/HDL RATIO: 3.5 ratio (ref <=5.0)
CHOLESTEROL: 215 mg/dL — AB (ref 125–200)
HDL: 62 mg/dL (ref >=46)
LDL Cholesterol: 135 mg/dL — ABNORMAL HIGH (ref <130)
Triglycerides: 88 mg/dL (ref <150)
VLDL: 18 mg/dL (ref <30)

## 2015-08-22 MED ORDER — PAROXETINE HCL 10 MG PO TABS: 10.0000 mg | ORAL_TABLET | Freq: Every day | ORAL | Status: DC

## 2015-08-22 MED ORDER — DONEPEZIL HCL 10 MG PO TABS: 10.0000 mg | ORAL_TABLET | Freq: Every day | ORAL | Status: DC

## 2015-08-22 MED ORDER — OLANZAPINE 5 MG PO TABS: 5.0000 mg | ORAL_TABLET | Freq: Every day | ORAL | Status: DC

## 2015-08-22 MED ORDER — MEMANTINE HCL 10 MG PO TABS: ORAL_TABLET | ORAL | Status: DC

## 2015-08-22 NOTE — Patient Instructions (Addendum)
Take Zyprexa 5 mg hs. Continue Namenda and Aricept as well as Paxil. Return in 6 months.

## 2015-08-22 NOTE — Progress Notes (Signed)
Subjective:    Patient ID: Amber Mcgrath, female    DOB: 04-Sep-1940, 75 y.o.   MRN: [id number]  HPI 75 year old [place] Female in today for health maintenance exam and evaluation of medical issues. 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 Amber Mcgrath has 3 brothers but Amber Mcgrath says they're not helping out much with Amber Mcgrath. 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 She bathes her mother twice daily and cooks for her. Whenever she goes out of town, it is a bit of a problem as no one is available to keep Mrs Torpey. She may need to have some respite care at a nursing home for several days at a time. This will give her daughter a break. Have suggested daughter look into this. Amber Mcgrath has 3 sons, the oldest of which is 62 and a set of twins age 108. Her husband currently is working in [place] also was a Advertising account planner. She would like to move to [place] but her oldest son does not want to move.  Patient recently saw a neurologist. They placed her on extended release Namenda. Amber Mcgrath has found out that that will cost several hundred dollars. They're not able to afford that medication. Patient is on Medicare and Medicaid. She will need to take non-extended release Namenda. She also wonders about the house at night. Doesn't sleep well some nights. We are going to try Zyprexa at bedtime to see if that helps her rest. She is already on Paxil for anxiety depression. She also takes Aricept.  Was given Prevnar 13 and fluid vaccine today.  Amber Mcgrath asks that we give her a handicap parking permit to use when she has to take her mother places. Mother has had several falls. Not clear why she is falling but one fall occurred while Amber Mcgrath was attempting to park the car.  Patient went to [place] to visit family a few months back. Amber Mcgrath thinks that family did not give patient her memory medications and when she returned her memory was actually worse.  Was seen here  for the first time and September 2015 and was diagnosed with memory issues at that time.  No history of serious illnesses accidents or operations other than memory loss. No known drug allergies. Patient does not smoke or consume alcohol. Her social history: She has total of 6 children.  Husband is deceased. He died probably sometime in 01/07/2014 of complications of a stroke. After death, family members began to notice issues with her memory.  Family history: Father died at age 85 of unknown causes. Mother living age 91. Father was possibly alcoholic with depression. One brother living. Other siblings deceased. No sisters. Brothers died of for injuries or other unknown causes.    Review of Systems  Constitutional: Negative.   HENT: Negative.   Respiratory: Negative.   Cardiovascular: Negative.   Genitourinary:       Incontinence at times  Neurological:       Significant memory loss and wandering at night  Hematological: Negative.        Objective:   Physical Exam  Constitutional: She appears well-developed and well-nourished. No distress.  Thin [place] female in no acute distress  HENT:  Head: Normocephalic and atraumatic.  Right Ear: External ear normal.  Left Ear: External ear normal.  Mouth/Throat: Oropharynx is clear and moist. No oropharyngeal exudate.  Eyes: Conjunctivae and EOM are normal. Pupils are equal, round, and reactive to light. Right  eye exhibits no discharge. Left eye exhibits no discharge. No scleral icterus.  Neck: Neck supple. No JVD present. No thyromegaly present.  Cardiovascular: Normal rate and regular rhythm.   No murmur heard. Pulmonary/Chest: Effort normal and breath sounds normal. No respiratory distress. She has no wheezes. She has no rales.  Breast normal female without masses  Abdominal: Soft. Bowel sounds are normal. She exhibits no distension and no mass. There is no tenderness. There is no rebound and no guarding.  Genitourinary:  Pap deferred  due to age. Bimanual normal.  Lymphadenopathy:    She has no cervical adenopathy.  Neurological: She is alert. She has normal reflexes. She displays normal reflexes. No cranial nerve deficit. Coordination normal.  Unaware of season, month, year, day of week. Knows name. Knows her daughter.  Skin: Skin is warm and dry. No rash noted. She is not diaphoretic.  Psychiatric: She has a normal mood and affect.  Vitals reviewed.         Assessment & Plan:  Memory loss. Family cannot afford extended release Namenda. Return to Namenda 10 mg twice a day and continue Aricept 10 mg daily. Recheck in 6 months.  History of falling-etiology unclear  Possible mild depression treated with Paxil  Wandering at night about the house-will treat with Zyprexa 5 mg at bedtime  Plan: Daughter to look into respite care for a  week at the time to give her a break. Prevnar and flu vaccines given. Return in 6 months. Try Zyprexa at bedtime. Continue Aricept and Namenda.   Subjective:   Patient presents for Medicare Annual/Subsequent preventive examination.  Review Past Medical/Family/Social: See above   Risk Factors  Current exercise habits: Sedentary Dietary issues discussed: Yes  Cardiac risk factors: None  Depression Screen  (Note: if answer to either of the following is "Yes", a more complete depression screening is indicated)   Over the past two weeks, have you felt down, depressed or hopeless? Daughter reports patient feel sad sometimes when talking about the past Over the past two weeks, have you felt little interest or pleasure in doing things? No Have you lost interest or pleasure in daily life? No Do you often feel hopeless? No Do you cry easily over simple problems? No   Activities of Daily Living  In your present state of health, do you have any difficulty performing the following activities?:   Driving? Does not drive Managing money? Does not manage money Feeding yourself? No    Getting from bed to chair? No  Climbing a flight of stairs? No  Preparing food and eating?: Daughter prepares meals Bathing or showering? Yes Getting dressed: No  Getting to the toilet? No  Using the toilet: Sometimes Moving around from place to place: No  In the past year have you fallen or had a near fall?: Yes several falls Are you sexually active? No  Do you have more than one partner? No   Hearing Difficulties: No  Do you often ask people to speak up or repeat themselves? Sometimes Do you experience ringing or noises in your ears? No  Do you have difficulty understanding soft or whispered voices? No  Do you feel that you have a problem with memory? Yes Do you often misplace items? Yes   Home Safety:  Do you have a smoke alarm at your residence? Yes Do you have grab bars in the bathroom? Yes  Do you have throw rugs in your house? No   Cognitive Testing  Alert? Yes  Normal Appearance?Yes  Oriented to person? Yes Place? No Time? No Recall of three objects? No Can perform simple calculations? No Displays appropriate judgment? Cannot tell Can read the correct time from a watch face? No  List the Names of Other Physician/Practitioners you currently use:  See referral list for the physicians patient is currently seeing.  Neurologist   Review of Systems: See above   Objective:     General appearance: Appears stated age and thin Head: Normocephalic, without obvious abnormality, atraumatic  Eyes: conj clear, EOMi PEERLA  Ears: normal TM's and external ear canals both ears  Nose: Nares normal. Septum midline. Mucosa normal. No drainage or sinus tenderness.  Throat: lips, mucosa, and tongue normal; teeth and gums normal  Neck: no adenopathy, no carotid bruit, no JVD, supple, symmetrical, trachea midline and thyroid not enlarged, symmetric, no tenderness/mass/nodules  No CVA tenderness.  Lungs: clear to auscultation bilaterally  Breasts: normal appearance, no masses or  tenderness Heart: regular rate and rhythm, S1, S2 normal, no murmur, click, rub or gallop  Abdomen: soft, non-tender; bowel sounds normal; no masses, no organomegaly  Musculoskeletal: ROM normal in all joints, no crepitus, no deformity, Normal muscle strengthen. Back  is symmetric, no curvature. Skin: Skin color, texture, turgor normal. No rashes or lesions  Lymph nodes: Cervical, supraclavicular, and axillary nodes normal.  Neurologic: CN 2 -12 Normal, Normal symmetric reflexes. Normal coordination and gait  Psych: Alert  Mood appear stable. Does not know season of the year, day of week, year, month.   Assessment:    Annual wellness medicare exam   Plan:    During the course of the visit the patient was educated and counseled about appropriate screening and preventive services including:   Flu vaccine and Prevnar 13 given today     Patient Instructions (the written plan) was given to the patient.  Medicare Attestation  I have personally reviewed:  The patient's medical and social history  Their use of alcohol, tobacco or illicit drugs  Their current medications and supplements  The patient's functional ability including ADLs,fall risks, home safety risks, cognitive, and hearing and visual impairment  Diet and physical activities  Evidence for depression or mood disorders  The patient's weight, height, BMI, and visual acuity have been recorded in the chart. I have made referrals, counseling, and provided education to the patient based on review of the above and I have provided the patient with a written personalized care plan for preventive services.

## 2015-08-23 LAB — VITAMIN D 25 HYDROXY (VIT D DEFICIENCY, FRACTURES): VIT D 25 HYDROXY: 24 ng/mL — AB (ref 30–100)

## 2015-09-04 NOTE — Telephone Encounter (Signed)
Daughter brings in form number N-648 from [employer], citizenship in immigration services requesting medical certification for disability exceptions.  Copy of form scanned in chart.

## 2015-09-25 ENCOUNTER — Telehealth: Payer: Self-pay

## 2015-09-25 NOTE — Telephone Encounter (Signed)
Patient daughter [name] contacted office stating that her mother is not sleeping at night. She states that she wakes her up at 2 or 3 talking about things that happened along time ago. She states that she doesn't know if from the medication or not. [name] states that she is giving her olanzapine  at bedtime for sleep. Please advise on what to do.

## 2015-09-25 NOTE — Telephone Encounter (Signed)
Patient's daughter notified.

## 2015-09-25 NOTE — Telephone Encounter (Signed)
Call in Zyprexa 5 mg daily at bedtime.

## 2015-09-28 ENCOUNTER — Telehealth: Payer: Self-pay

## 2015-09-28 MED ORDER — OLANZAPINE 10 MG PO TABS: 10.0000 mg | ORAL_TABLET | Freq: Every day | ORAL | Status: AC

## 2015-09-28 NOTE — Telephone Encounter (Signed)
Patient is to increase the zyprexa from 5mg  to 10 mg. I will send a new prescription for  to pharmacy . Patients daughter [name] notified.

## 2015-10-03 ENCOUNTER — Telehealth: Payer: Self-pay

## 2015-10-03 DIAGNOSIS — E559 Vitamin D deficiency, unspecified: Secondary | ICD-10-CM

## 2015-10-03 DIAGNOSIS — F0390 Unspecified dementia without behavioral disturbance: Secondary | ICD-10-CM

## 2015-10-03 NOTE — Telephone Encounter (Signed)
Patient Daughter [name] contacted to see how the increased dose of zyprexa is helping Amber Mcgrath sleep. [name] states that the  is helping and that she is sleeping well most nights. She was also informed that her mothers FL2 form is complete and ready for pick-up.

## 2015-11-07 ENCOUNTER — Encounter: Payer: Self-pay | Admitting: Neurology

## 2015-11-07 ENCOUNTER — Ambulatory Visit (INDEPENDENT_AMBULATORY_CARE_PROVIDER_SITE_OTHER): Payer: Medicare Other | Admitting: Neurology

## 2015-11-07 VITALS — BP 130/62 | HR 68 | Resp 16 | Ht 60.0 in | Wt 123.0 lb

## 2015-11-07 DIAGNOSIS — F0280 Dementia in other diseases classified elsewhere without behavioral disturbance: Secondary | ICD-10-CM | POA: Diagnosis not present

## 2015-11-07 DIAGNOSIS — G301 Alzheimer's disease with late onset: Secondary | ICD-10-CM | POA: Diagnosis not present

## 2015-11-07 DIAGNOSIS — F329 Major depressive disorder, single episode, unspecified: Secondary | ICD-10-CM | POA: Diagnosis not present

## 2015-11-07 DIAGNOSIS — F32A Depression, unspecified: Secondary | ICD-10-CM

## 2015-11-07 MED ORDER — MEMANTINE HCL 10 MG PO TABS: ORAL_TABLET | ORAL | Status: DC

## 2015-11-07 NOTE — Patient Instructions (Addendum)
I would recommend that we continue with your memory medications including that donepezil 10 mg once daily and memantine 10 mg twice daily. Both are generic. I changed the memantine prescription to 90 days, the donepezil is 10 mg and also 90 day prescription.  Please make sure you drink more water and eat well.

## 2015-11-07 NOTE — Progress Notes (Addendum)
Subjective:    Patient ID: Amber Mcgrath is a 75 y.o. female.  HPI     Interim history:  Amber Mcgrath is a very pleasant 75 year old right-handed woman with an underlying history of depression, borderline diabetes, who presents for follow-up consultation of her memory loss. The patient is accompanied by her second youngest daughter, Nhung Stanton Kidney") today and interpreter, Mr. [place]. I last saw her on 08/01/2015, at which time her MMSE was 13/30, CDT: 2/4, AFT: 3/min. her daughter reported that she had to move in with her. The patient was not able to live alone anymore. Her daughter felt overwhelmed as other siblings were not helping out as much. Her daughter was also working off-and-on at a [employer]. Her husband was traveling quite a bit. The patient fell a few days prior and had bruised her face. There was no loss of consciousness or serious injuries or symptoms at the time but she did have shoulder soreness on the left and a bruise on her face. They did not seek medical attention at the time. She did have a follow-up appointment with her primary care physician coming up. She was not able to walk as much. She had more confusion. The daughter requested a handicap sticker because it was getting more and more difficult for the patient to walk and if dropped off at the main entrance of a store or doctor's office she had a tendency to wander off. Her daughter was also working on the patient's citizenship paperwork and will request information from my end of things regarding her memory loss. We talked about power of attorney and she was advised to seek legal advice for that. Her memory have become worse, she had more confusion and disorientation. She needed more help with ADLs. Based on the history and her memory scores have suggested we start her on Namenda long-acting 7 mg once daily in addition to continuation with donepezil 10 mg daily.  Today, 11/07/2015: She reports not much. She denies pain. Per  daughter, patient recently had upper respiratory infection. The patient also did not do so well when she had to stay with one of her sons. Daughter was traveling at the time to [place] and [place]. Daughter has been back this past weekend and patient will be staying with her. Daughter is at home most of the time. Her son and daughter-in-law work full-time and calm home late, she has not necessarily been eating and drinking at a regular basis at her son's house because she does need reminders for those day-to-day issues. She has been more restless at times. She has been on generic Paxil. She has had trouble sleeping at night and primary care physician tried her on low-dose Zyprexa, then 10 mg as needed which daughter uses very sparingly. She has had some minor falls, thankfully nothing serious, no injuries reported.   Previously:   Of note, the patient no showed for an appointment on 12/28/2014. I saw her on 08/29/2014, at which time the patient reported doing well. She had no new complaints. Her daughter reported that she was able to tolerate Aricept at 5 mg strength without any side effects noted. They were planning to have her go to [place] for about 2-3 months so she could be with extended family and friends. I suggested we increase the Aricept to 10 mg once daily. She was encouraged to drink more water.  I first met her on 05/03/2014 at the request of her primary care physician, at which time her daughter  reported an approximately eight-month history of progressive memory loss. At the time of her first visit her MMSE was 20 out of 30. I suggested low-dose generic Aricept at 5 mg daily. She has previously had appropriate blood work and an MRI so I did not add any new test. I suggested she take a baby aspirin as opposed to an adult size aspirin daily as she did not have any history of stroke or TIA previously. She also had no history of heart disease.   She had a brain MRI with and without contrast on  04/22/2014: 1. No acute intracranial infarct or other abnormality identified. 2. 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 In addition, personally reviewed the images through the PACS system. Blood work from 04/12/2014 showed: normal B12 at 486, CBC with differential was normal, normal UA, CMP was normal with the exception of elevated blood sugar level at 218, A1c was 6.3, folate normal and TSH normal.  She primarily has been having difficulty with short-term memory such as forgetfulness, misplacing things, asking the same question again and forgetting dates and events. There is no report of confusion or disorientation. Familiar faces are easily recognized.  She reports no recurrent headaches. She does not drive. She does not smoke or drink alcohol.   There is no report of Auditory Hallucinations and Visual Hallucinations and there are no delusions, such as paranoia.   She has not been on any dementia medications.    The patient denies prior TIA or stroke symptoms, such as sudden onset of one sided weakness, numbness, tingling, slurring of speech or droopy face, hearing loss, tinnitus, diplopia or visual field cut or monocular loss of vision, and denies recurrent headaches.   Of note, the patient denies snoring, and there is no report of witnessed apneas or choking sensations while asleep. She does not drive.   Her Past Medical History Is Significant For: No past medical history on file.  Her Past Surgical History Is Significant For: No past surgical history on file.  Her Family History Is Significant For: Family History  Problem Relation Age of Onset  . Depression Father     Her Social History Is Significant For: Social History   Social History  . Marital Status: Widowed    Spouse Name: N/A  . Number of Children: 6  . Years of Education: 12   Occupational History  .      not employed   Social History Main Topics  . Smoking  status: Never Smoker   . Smokeless tobacco: Never Used  . Alcohol Use: No  . Drug Use: No  . Sexual Activity: Not Asked   Other Topics Concern  . None   Social History Narrative   Patient is right handed and resides with children    Her Allergies Are:  No Known Allergies:   Her Current Medications Are:  Outpatient Encounter Prescriptions as of 11/07/2015  Medication Sig  . aspirin 325 MG tablet Take 81 mg by mouth daily.   Marland Kitchen donepezil (ARICEPT) 10 MG tablet Take 1 tablet (10 mg total) by mouth at bedtime.  . memantine (NAMENDA) 10 MG tablet Take 1 tablets twice daily  . Multiple Vitamin (MULTIVITAMIN) capsule Take 1 capsule by mouth daily.  Marland Kitchen OLANZapine (ZYPREXA) 10 MG tablet Take 1 tablet (10 mg total) by mouth at bedtime.  . Omega-3 Fatty Acids (FISH OIL PO) Take by mouth.  Marland Kitchen PARoxetine (PAXIL) 10 MG tablet Take 1 tablet (10  mg total) by mouth daily.  . [DISCONTINUED] donepezil (ARICEPT) 10 MG tablet Take 1 tablet (10 mg total) by mouth at bedtime.  . [DISCONTINUED] OLANZapine (ZYPREXA) 5 MG tablet Take 1 tablet (5 mg total) by mouth at bedtime.   No facility-administered encounter medications on file as of 11/07/2015.  :  Review of Systems:  Out of a complete 14 point review of systems, all are reviewed and negative with the exception of these symptoms as listed below:   Review of Systems  Neurological:       Daughter reports that patient seems to be same as last visit. States that the Donepezil and Memantine are expensive for them.     Objective:  Neurologic Exam  Physical Exam Physical Examination:   Filed Vitals:   11/07/15 1029  BP: 130/62  Pulse: 68  Resp: 16    General Examination: The patient is a very pleasant 75 y.o. female in no acute distress. She is calm and cooperative with the exam. She is minimally verbal, but does interact with the interpreter well. She answers in multi word sentences.  HEENT: Normocephalic, atraumatic, pupils are equal, round  and reactive to light and accommodation. Extraocular tracking shows no saccadic breakdown without nystagmus noted. Hearing is intact. Face is symmetric with no facial masking and normal facial sensation. She has remnants of a left sided black eye. She has a healing scar under the left eye with evidence of laceration, about half an inch long. Wound does not look irritated or oozing. There is no lip, neck or jaw tremor. Neck is not rigid with intact passive ROM. There are no carotid bruits on auscultation. Oropharynx exam reveals mild mouth dryness. No significant airway crowding is noted. Mallampati is class III. Tongue protrudes centrally and palate elevates symmetrically.    Chest: is clear to auscultation without wheezing, rhonchi or crackles noted.  Heart: sounds are regular and normal without murmurs, rubs or gallops noted.   Abdomen: is soft, non-tender and non-distended with normal bowel sounds appreciated on auscultation.  Extremities: There is no pitting edema in the distal lower extremities bilaterally. She reports left shoulder pain but has full range of motion. She reports soreness of her left upper arm but there is no bruise. There is no swelling.  Skin: is warm and dry with no trophic changes noted. Age-related changes are noted on the skin.   Musculoskeletal: exam reveals no obvious joint deformities, tenderness or joint swelling or erythema.   Neurologically:  Mental status: The patient is awake and alert, paying fair attention. She is cannot really provide the history. Her daughter provides her Hx. She is oriented to: person. Her memory, attention, language and knowledge are impaired. There is no aphasia, agnosia, apraxia or anomia. There is a no signficant degree of bradyphrenia. Speech is not hypophonic with no dysarthria noted. Mood is congruent and affect seems blunted.   On 05/03/14: Her MMSE (Mini-Mental state exam): 20/30, CDT (Clock Drawing Test): 4/4 and AFT: 6/min. Geriatric  Depression Scale Score: 4/15.   On 08/01/2015: MMSE: 13/30, CDT: 2/4, AFT: 3/min.  Cranial nerves are as described above under HEENT exam. In addition, shoulder shrug is normal with equal shoulder height noted.  Motor exam: Normal bulk, and strength for age is noted. Tone is not rigid with absence of cogwheeling in the extremities. There is overall no bradykinesia. There is no drift or rebound. There is no tremor.  Romberg is negative. Reflexes are 1+ in the upper extremities and 1+ in  the lower extremities. Fine motor skills: Finger taps, hand movements, and rapid alternating patting are not impaired bilaterally. Foot taps and foot agility are not impaired bilaterally.   Cerebellar testing shows no dysmetria or intention tremor on finger to nose testing. Heel to shin is unremarkable. There is no truncal or gait ataxia.   Sensory exam is intact to light touch in the upper and lower extremities.   Gait, station and balance: She stands up from the seated position with no difficulty. No veering to one side is noted. No leaning to one side. Posture is age appropriate. Stance is narrow-based. She turns in 2 steps. Balance is very mildly impaired.   Assessment and Plan:   In summary, Nashla Althoff is a very pleasant 75 year old female with an underlying history of depression, and borderline diabetes, who presents for follow-up consultation of her dementia without behavioral problems. She has had more problems sleeping at night. Primary care physician has provided the patient with a prescription for Zyprexa, first 5 mg as needed, now on 10 mg at night as needed. Daughter uses this very sparingly and there are multiple tablets still left in the bottle from her prescription from February. Patient has spent some time with one of her sons as the daughter had to travel recently, daughter reports that things were less scheduled and less well controlled at her brother's house and both her brother and sister-in-law  work and therefore food was provided but left for the patient to serve herself and also water intake was less. Daughter is back in town and patient stays with her now. We mutually agreed to continue with generic donepezil 10 mg once daily and memantine 10 mg twice daily. I renewed her Generic Namenda prescription for 90 days supply and she did not need a refill on her donepezil. Her memory loss has been progressive for the past 2+ years, maybe 3 years at this time. History and physical exam suggest late onset Alzheimer's disease without behavioral disturbance but she has some issues with depression for which she is on low-dose paroxetine her primary care physician. She had a brain MRI and labs which we  have previously discussed. She requires supervision at this time. They have applied for her citizenship And her daughter has power of attorney as I understand.  I would like to see her back in 4 months, sooner if needed. I answered all her daughter's questions today and the patient and her daughter were in agreement.  I spent 25 minutes in total face-to-face time with the patient, more than 50% of which was spent in counseling and coordination of care, reviewing test results, reviewing medication and discussing or reviewing the diagnosis of AD, its prognosis and treatment options.

## 2015-11-22 ENCOUNTER — Telehealth: Payer: Self-pay | Admitting: Neurology

## 2015-11-22 DIAGNOSIS — G301 Alzheimer's disease with late onset: Secondary | ICD-10-CM

## 2015-11-22 DIAGNOSIS — F0390 Unspecified dementia without behavioral disturbance: Secondary | ICD-10-CM

## 2015-11-22 DIAGNOSIS — F0280 Dementia in other diseases classified elsewhere without behavioral disturbance: Secondary | ICD-10-CM

## 2015-11-22 MED ORDER — MEMANTINE HCL 10 MG PO TABS: ORAL_TABLET | ORAL | Status: DC

## 2015-11-22 MED ORDER — DONEPEZIL HCL 10 MG PO TABS: 10.0000 mg | ORAL_TABLET | Freq: Every day | ORAL | Status: DC

## 2015-11-22 NOTE — Telephone Encounter (Signed)
Pt's daughter is requesting a 3 month supply of donepezil (ARICEPT) 10 MG tablet , memantine (NAMENDA) 10 MG tablet , PARoxetine (PAXIL) 10 MG tablet. Please send to [facility] May call [phone]

## 2015-11-22 NOTE — Telephone Encounter (Signed)
Spoke with Gar PontoMary Yolan, daughter (per DPR) and advised her that Dr. Vickey Hugerohmeier refilled the aricept and nameda RXs for a 90 day supply. However, the paxil needs to be refilled by pt's PCP, Dr. Lenord FellersBaxley. Pt's daughter verbalized understanding.

## 2015-11-22 NOTE — Telephone Encounter (Signed)
I refilled an 90 day supply for Aricept and Namenda for this patient packs a has been prescribed by her primary care physician, Dr. Lorelee Covermarry John Baxley and she should contact her office for a refill on this medication. Thank you.CD

## 2016-01-26 IMAGING — MR MR HEAD WO/W CM
10 of 12 series · 34 of 48 positions shown · IV contrast (multihance)
Comparison: None.

CLINICAL DATA: Memory loss

EXAM:
MRI HEAD WITHOUT AND WITH CONTRAST
TECHNIQUE: Multiplanar, multiecho pulse sequences of the brain and surrounding
structures were obtained without and with intravenous contrast.
CONTRAST:  12mL MULTIHANCE GADOBENATE DIMEGLUMINE 529 MG/ML IV SOLN

[Series 2: T1 · sagittal · 5.0mm · 0.45mm/px · 3 of 19 slices shown]
[im 1/19]
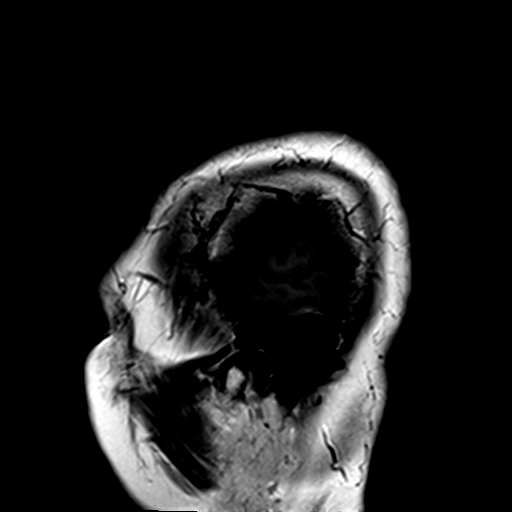
[im 10/19]
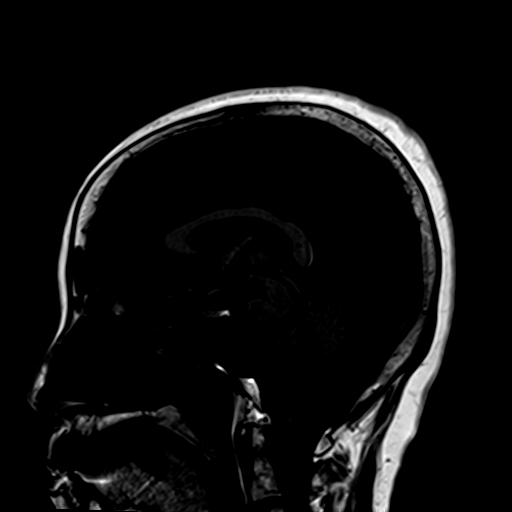
[im 19/19]
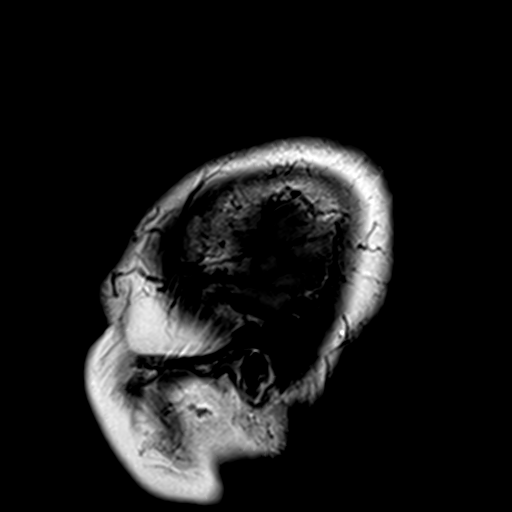

[Series 3: T2 · axial · 5.0mm · 0.45mm/px · z∈[-82,+54]mm · 3 of 22 slices shown (1 of 2)]
[im 1/22]
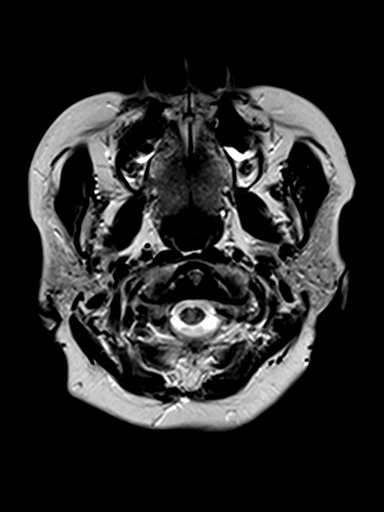
[im 11/22]
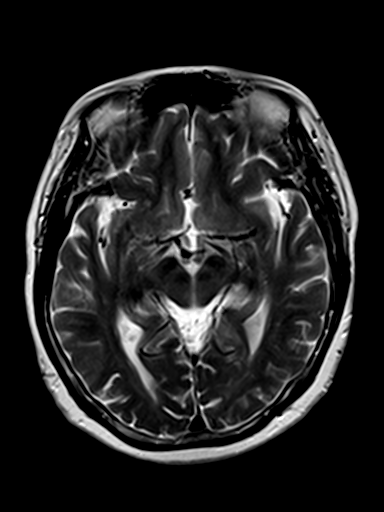
[im 22/22]
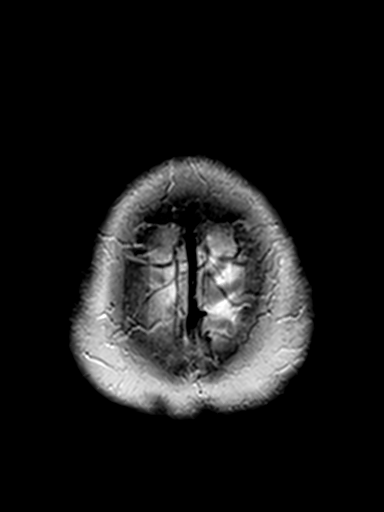

[Series 4: DWI · axial · 5.0mm · 0.90mm/px · z∈[-85,+51]mm · 4 of 44 slices shown (1 of 2)]
[im 1/44]
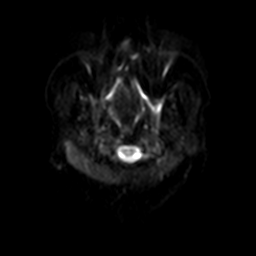
[im 15/44]
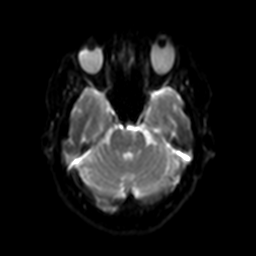
[im 29/44]
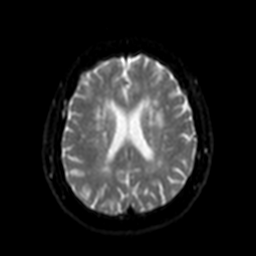
[im 44/44]
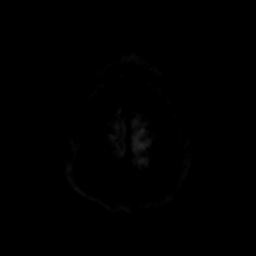

[Series 5: dwi_adc · axial · 5.0mm · 0.90mm/px · z∈[-85,+51]mm · 2 of 22 slices shown]
[im 1/22]
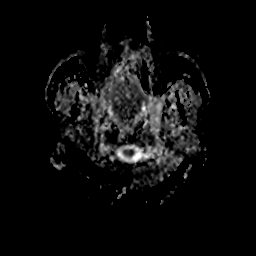
[im 22/22]
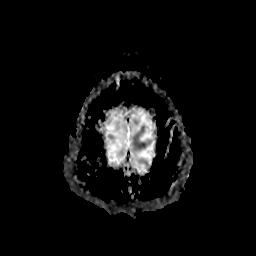

[Series 6: DWI · coronal · 5.0mm · 0.90mm/px · 6 of 60 slices shown (2 of 2)]
[im 1/60]
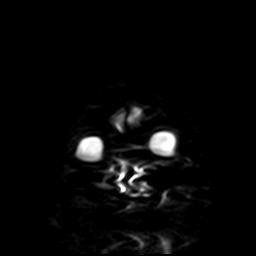
[im 12/60]
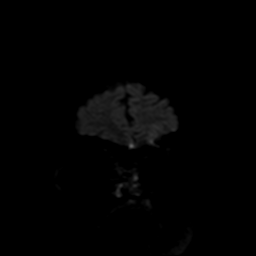
[im 24/60]
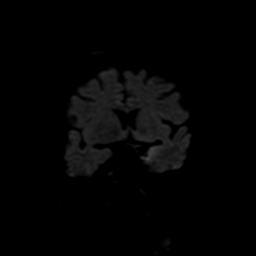
[im 36/60]
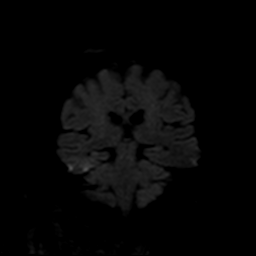
[im 48/60]
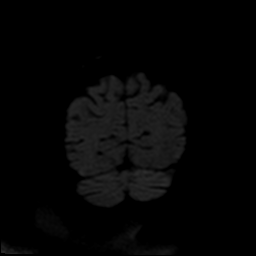
[im 60/60]
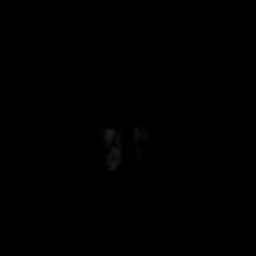

[Series 7: cor dwi_adc · coronal · 5.0mm · 0.90mm/px · 3 of 30 slices shown]
[im 1/30]
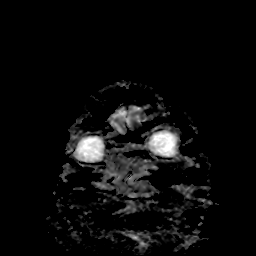
[im 15/30]
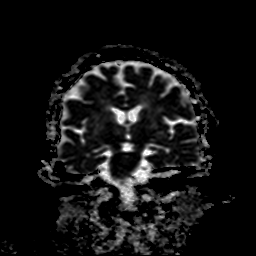
[im 30/30]
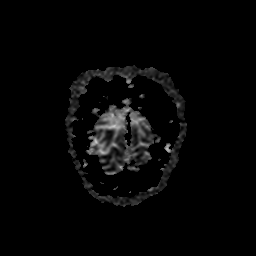

[Series 8: FLAIR · axial · 5.0mm · 0.45mm/px · z∈[-83,+53]mm · 2 of 22 slices shown]
[im 1/22]
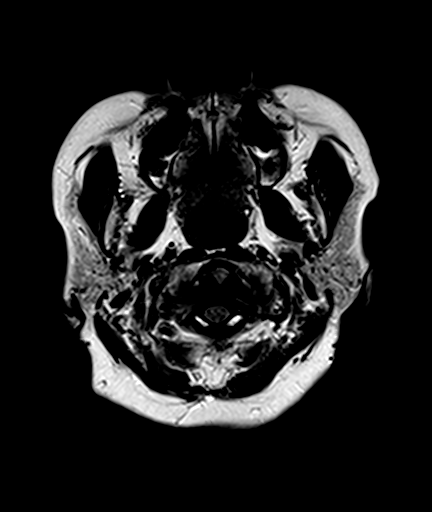
[im 22/22]
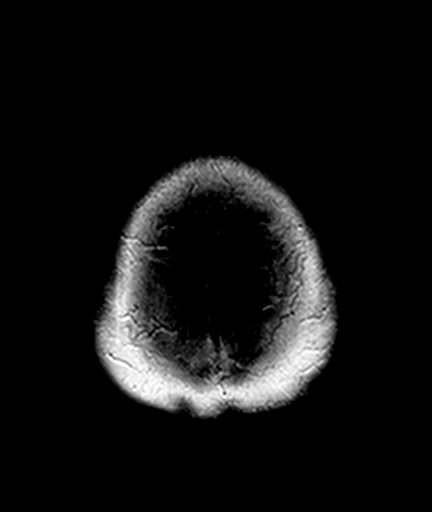

[Series 9: axial (person_name)1 volume · axial · 2.0mm · 0.45mm/px · z∈[-84,+56]mm · 7 of 72 slices shown]
[im 1/72]
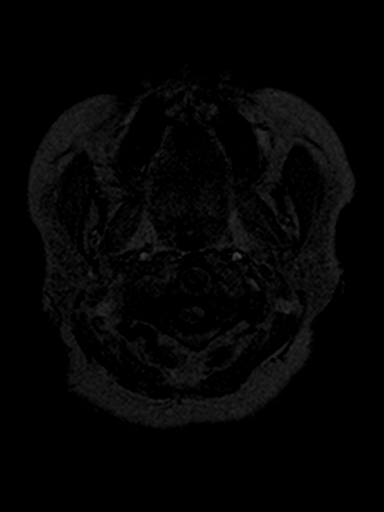
[im 12/72]
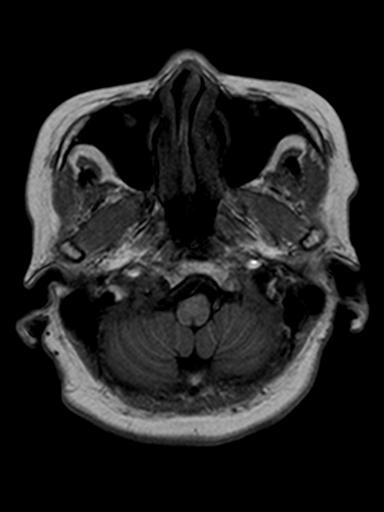
[im 24/72]
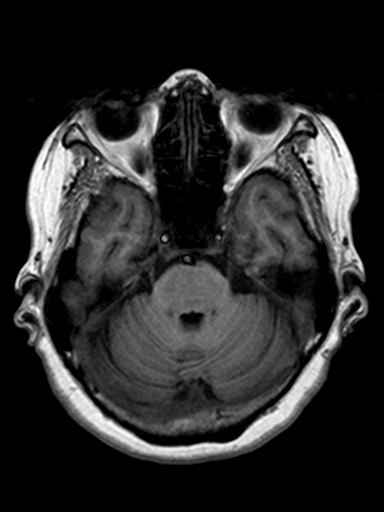
[im 36/72]
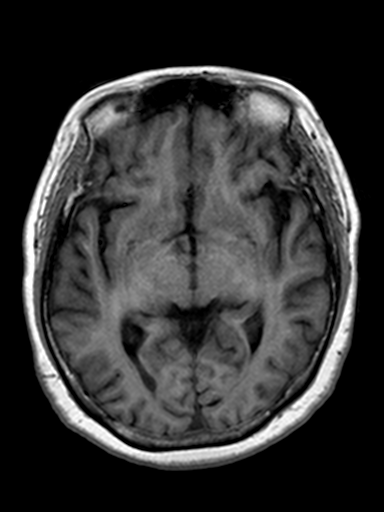
[im 48/72]
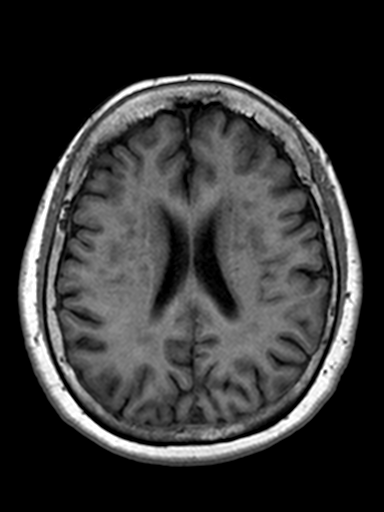
[im 60/72]
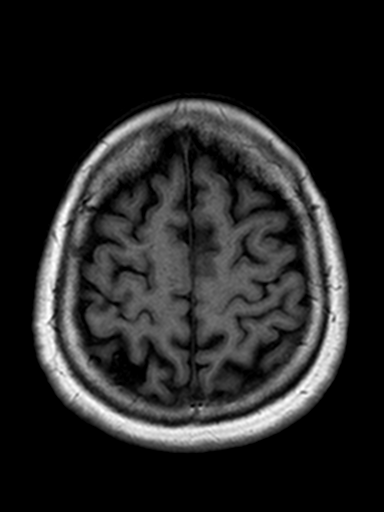
[im 72/72]
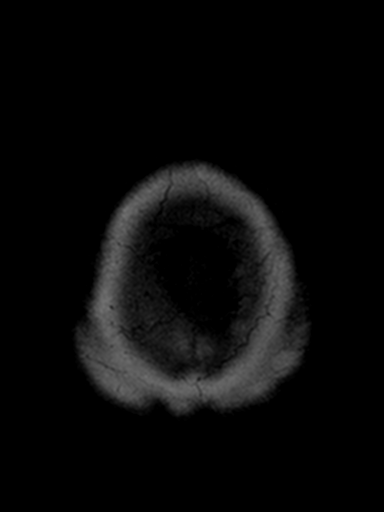

[Series 11: swi_images · axial · 2.0mm · 0.90mm/px · z∈[-85,-63]mm · 2 of 72 slices shown]
[im 1/72]
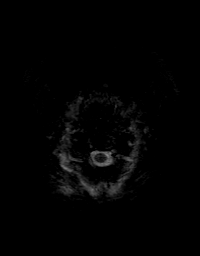
[im 12/72]
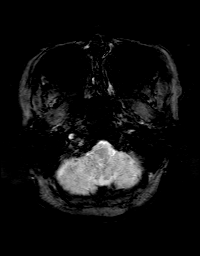

[Series 12: T2 · coronal · 5.0mm · 0.45mm/px · 2 of 24 slices shown (2 of 2)]
[im 1/24]
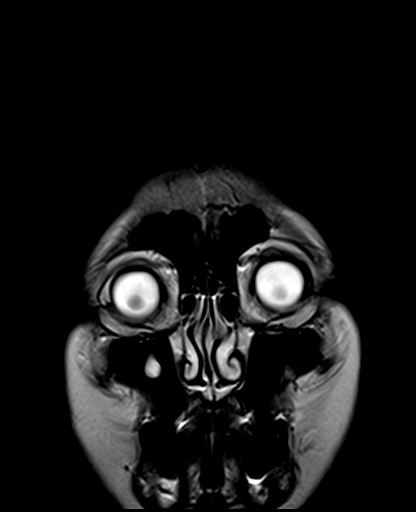
[im 24/24]
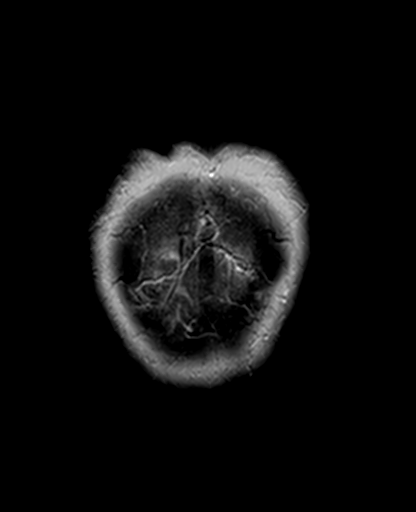

[34 of 48 positions shown; findings below may reference images not displayed]

FINDINGS: Mild diffuse prominence of the CSF containing spaces is compatible
with generalized age-related atrophy. Extensive patchy T2/FLAIR
hyperintensity within the periventricular and deep white matter both
cerebral hemispheres is most consistent with moderate to advanced
chronic small vessel ischemic disease. Similar changes are seen
within the midbrain and pons.

No mass lesion, midline shift, or extra-axial fluid collection.
Ventricles are normal in size without evidence of hydrocephalus.

No abnormal enhancement seen on post-contrast sequences.

No diffusion-weighted signal abnormality is identified to suggest
acute intracranial infarct. Gray-white matter differentiation is
maintained. Normal flow voids are seen within the intracranial
vasculature. No intracranial hemorrhage identified.

The cervicomedullary junction is normal. Pituitary gland is within
normal limits. Pituitary stalk is midline. The globes and optic
nerves demonstrate a normal appearance with normal signal intensity.
The

The bone marrow signal intensity is normal. Calvarium is intact.
Visualized upper cervical spine is within normal limits.

Scalp soft tissues are unremarkable.

Small retention cyst noted within the right maxillary sinus.
Paranasal sinuses are otherwise clear. No mastoid effusion.
IMPRESSION: 1. No acute intracranial infarct or other abnormality identified.
2. Generalized cerebral atrophy with moderate to advanced chronic
small vessel ischemic disease.

## 2016-02-20 ENCOUNTER — Encounter: Payer: Self-pay | Admitting: Internal Medicine

## 2016-02-20 ENCOUNTER — Ambulatory Visit (INDEPENDENT_AMBULATORY_CARE_PROVIDER_SITE_OTHER): Payer: Medicare Other | Admitting: Internal Medicine

## 2016-02-20 VITALS — BP 126/82 | HR 63 | Temp 97.4°F | Resp 18 | Ht 60.0 in | Wt 124.0 lb

## 2016-02-20 DIAGNOSIS — R7309 Other abnormal glucose: Secondary | ICD-10-CM

## 2016-02-20 DIAGNOSIS — R7302 Impaired glucose tolerance (oral): Secondary | ICD-10-CM | POA: Diagnosis not present

## 2016-02-20 DIAGNOSIS — R7301 Impaired fasting glucose: Secondary | ICD-10-CM

## 2016-02-20 DIAGNOSIS — F0390 Unspecified dementia without behavioral disturbance: Secondary | ICD-10-CM | POA: Diagnosis not present

## 2016-02-20 DIAGNOSIS — R413 Other amnesia: Secondary | ICD-10-CM

## 2016-02-20 LAB — HEMOGLOBIN A1C
Hgb A1c MFr Bld: 5.9 % — ABNORMAL HIGH (ref <5.7)
Mean Plasma Glucose: 123 mg/dL

## 2016-02-20 MED ORDER — PAROXETINE HCL 10 MG PO TABS: 10.0000 mg | ORAL_TABLET | Freq: Every day | ORAL | Status: DC

## 2016-02-20 NOTE — Progress Notes (Signed)
   Subjective:    Patient ID: Amber Mcgrath, female    DOB: 1940/09/19, 75 y.o.   MRN: [id number]  HPI 75 year old [place] Female in today for six-month recheck. She has a history of memory loss and is also followed by a neurologist. She is on Namenda and Aricept. She is on Paxil and Zyprexa. Her behavior is about the same according to her daughter, Amber Mcgrath. Patient is now residing with one of her sons. Amber Mcgrath also spends a fair amount of time with her but is open to new nail salon recently. Amber Mcgrath notes that she doesn't speak very much anymore. She still wonder sometimes at night but overall is about the same. She doesn't complain of any pain or discomfort. Amber Mcgrath has noted that sometimes pt. does not appear to be hungry. Her weight is almost the same. It is now 124 pounds and 6 months ago was 126 pounds.  We are not sure how long she has had the symptoms but 2 years ago when patient's husband died she exhibited some detachment about his death. I suspect she's had these symptoms for more than 2 years perhaps more like 5 years.  Spoke with daughter today giving anticipatory guidance for the future with this illness and what to expect.    Review of Systems see above. No new complaints.     Objective:   Physical Exam Skin warm and dry. Nodes none. Neck is supple without JVD thyromegaly or carotid bruits. Chest clear to auscultation. Cardiac exam regular rate and rhythm normal S1 and S2. Extremities without edema. She is able to walk on the Rhinocort. Doesn't show any ataxia. No gross focal deficits on brief neurological exam. She does not speak unless spoken to by her daughter. She does not understand [place].       Assessment & Plan:  Memory loss-likely Alzheimer's dementia  Impaired glucose tolerance-hemoglobin A1c drawn and pending.  Plan: We'll check TSH B 12 and folate levels today. TSH was normal 6 months ago. B-12 and folate have not been checked since 2015. Were also given a check hemoglobin  A1c. In 2015 hemoglobin A1c was 6.3%. Continue same medications and return for physical examination January 2018.

## 2016-02-20 NOTE — Patient Instructions (Signed)
Continue same medications and return in 6 months for physical exam. TSH, B-12 and folate levels drawn today along with hemoglobin A1c.

## 2016-02-21 LAB — TSH: TSH: 0.98 m[IU]/L

## 2016-02-21 LAB — VITAMIN B12: Vitamin B-12: 564 pg/mL (ref 200–1100)

## 2016-02-21 LAB — FOLATE

## 2016-03-12 ENCOUNTER — Ambulatory Visit (INDEPENDENT_AMBULATORY_CARE_PROVIDER_SITE_OTHER): Payer: Medicare Other | Admitting: Neurology

## 2016-03-12 ENCOUNTER — Encounter: Payer: Self-pay | Admitting: Neurology

## 2016-03-12 VITALS — BP 120/72 | HR 78 | Resp 14 | Ht 60.0 in | Wt 123.0 lb

## 2016-03-12 DIAGNOSIS — F329 Major depressive disorder, single episode, unspecified: Secondary | ICD-10-CM

## 2016-03-12 DIAGNOSIS — F0280 Dementia in other diseases classified elsewhere without behavioral disturbance: Secondary | ICD-10-CM | POA: Diagnosis not present

## 2016-03-12 DIAGNOSIS — G301 Alzheimer's disease with late onset: Secondary | ICD-10-CM | POA: Diagnosis not present

## 2016-03-12 DIAGNOSIS — F32A Depression, unspecified: Secondary | ICD-10-CM

## 2016-03-12 NOTE — Progress Notes (Signed)
Subjective:    Patient ID: Amber Mcgrath is a 75 y.o. female.  HPI     Interim history:   [name] is a very pleasant 75 year old right-handed woman with an underlying history of depression, borderline diabetes, who presents for follow-up consultation of her dementia. The patient is accompanied by her second youngest daughter, Amber Mcgrath") today they declined an interpreter, as patient is less vocal and less comfortable with a stranger, per daughter. I last saw her on 11/07/2015, at which time I suggested we continue with donepezil 10 mg daily and generic Namenda 10 mg twice daily. Daughter reported that patient had a recent upper respiratory infection. Daughter has been her main caretaker. Patient was noted to have some restlessness. She was in generic Paxil and primary care physician had tried her on low-dose Zyprexa, first 5 mg, then increase to 10 mg and daughter was using this as needed only. Daughter reported some minor falls, thankfully no injuries.  Today, 03/12/2016: she reports feeling fine, no recent falls, appetite okay. Currently staying with her son and his family. Daughter will have her at her place for about 3 weeks next. No big changes, does not exercise very much, complains of feeling tired or exhausted. Daughter and her family usually walk outside in the neighborhood every night but patient does not usually go with them. She is tolerating her medications.    Previously:   I saw her on 08/01/2015, at which time her MMSE was 13/30, CDT: 2/4, AFT: 3/min. her daughter reported that she had to move in with her. The patient was not able to live alone anymore. Her daughter felt overwhelmed as other siblings were not helping out as much. Her daughter was also working off-and-on at a [employer]. Her husband was traveling quite a bit. The patient fell a few days prior and had bruised her face. There was no loss of consciousness or serious injuries or symptoms at the time but she did have  shoulder soreness on the left and a bruise on her face. They did not seek medical attention at the time. She did have a follow-up appointment with her primary care physician coming up. She was not able to walk as much. She had more confusion. The daughter requested a handicap sticker because it was getting more and more difficult for the patient to walk and if dropped off at the main entrance of a store or doctor's office she had a tendency to wander off. Her daughter was also working on the patient's citizenship paperwork and will request information from my end of things regarding her memory loss. We talked about power of attorney and she was advised to seek legal advice for that. Her memory have become worse, she had more confusion and disorientation. She needed more help with ADLs. Based on the history and her memory scores have suggested we start her on Namenda long-acting 7 mg once daily in addition to continuation with donepezil 10 mg daily.   Of note, the patient no showed for an appointment on 12/28/2014. I saw her on 08/29/2014, at which time the patient reported doing well. She had no new complaints. Her daughter reported that she was able to tolerate Aricept at 5 mg strength without any side effects noted. They were planning to have her go to [place] for about 2-3 months so she could be with extended family and friends. I suggested we increase the Aricept to 10 mg once daily. She was encouraged to drink more water.  I first met her on 05/03/2014 at the request of her primary care physician, at which time her daughter reported an approximately eight-month history of progressive memory loss. At the time of her first visit her MMSE was 20 out of 30. I suggested low-dose generic Aricept at 5 mg daily. She has previously had appropriate blood work and an MRI so I did not add any new test. I suggested she take a baby aspirin as opposed to an adult size aspirin daily as she did not have any history of  stroke or TIA previously. She also had no history of heart disease.    She had a brain MRI with and without contrast on 04/22/2014: 1. No acute intracranial infarct or other abnormality identified. 2. 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 In addition, personally reviewed the images through the PACS system. Blood work from 04/12/2014 showed: normal B12 at 486, CBC with differential was normal, normal UA, CMP was normal with the exception of elevated blood sugar level at 218, A1c was 6.3, folate normal and TSH normal.   She primarily has been having difficulty with short-term memory such as forgetfulness, misplacing things, asking the same question again and forgetting dates and events. There is no report of confusion or disorientation. Familiar faces are easily recognized.   She reports no recurrent headaches. She does not drive. She does not smoke or drink alcohol.   There is no report of Auditory Hallucinations and Visual Hallucinations and there are no delusions, such as paranoia.   She has not been on any dementia medications.    The patient denies prior TIA or stroke symptoms, such as sudden onset of one sided weakness, numbness, tingling, slurring of speech or droopy face, hearing loss, tinnitus, diplopia or visual field cut or monocular loss of vision, and denies recurrent headaches.   Of note, the patient denies snoring, and there is no report of witnessed apneas or choking sensations while asleep. She does not drive.    Her Past Medical History Is Significant For: No past medical history on file.  Her Past Surgical History Is Significant For: No past surgical history on file.  Her Family History Is Significant For: Family History  Problem Relation Age of Onset  . Depression Father     Her Social History Is Significant For: Social History   Social History  . Marital status: Widowed    Spouse name: N/A  . Number of children:  6  . Years of education: 33   Occupational History  .      not employed   Social History Main Topics  . Smoking status: Never Smoker  . Smokeless tobacco: Never Used  . Alcohol use No  . Drug use: No  . Sexual activity: Not Asked   Other Topics Concern  . None   Social History Narrative   Patient is right handed and resides with children    Her Allergies Are:  No Known Allergies:   Her Current Medications Are:  Outpatient Encounter Prescriptions as of 03/12/2016  Medication Sig  . aspirin 325 MG tablet Take 81 mg by mouth daily.   Marland Kitchen donepezil (ARICEPT) 10 MG tablet Take 1 tablet (10 mg total) by mouth at bedtime.  . memantine (NAMENDA) 10 MG tablet Take 1 tablets twice daily  . Multiple Vitamin (MULTIVITAMIN) capsule Take 1 capsule by mouth daily.  Marland Kitchen OLANZapine (ZYPREXA) 10 MG tablet Take 1 tablet (10 mg total) by mouth at bedtime.  Marland Kitchen  Omega-3 Fatty Acids (FISH OIL PO) Take by mouth.  Marland Kitchen PARoxetine (PAXIL) 10 MG tablet Take 1 tablet (10 mg total) by mouth daily.   No facility-administered encounter medications on file as of 03/12/2016.   :  Review of Systems:  Out of a complete 14 point review of systems, all are reviewed and negative with the exception of these symptoms as listed below: Review of Systems  Neurological:       No new concerns.  Patient tolerates medication well.     Objective:  Neurologic Exam  Physical Exam Physical Examination:   Vitals:   03/12/16 1255  BP: 120/72  Pulse: 78  Resp: 14    General Examination: The patient is a very pleasant 75 y.o. female in no acute distress. She is calm and cooperative with the exam. She is minimally verbal, but does interact with the interpreter well. She answers in multi word sentences.  HEENT: Normocephalic, atraumatic, pupils are equal, round and reactive to light and accommodation. Extraocular tracking shows no saccadic breakdown without nystagmus noted. Hearing is intact. Face is symmetric with no facial  masking and normal facial sensation. She has remnants of a left sided black eye. She has a healing scar under the left eye with evidence of laceration, about half an inch long. Wound does not look irritated or oozing. There is no lip, neck or jaw tremor. Neck is not rigid with intact passive ROM. There are no carotid bruits on auscultation. Oropharynx exam reveals mild mouth dryness. No significant airway crowding is noted. Mallampati is class III. Tongue protrudes centrally and palate elevates symmetrically.    Chest: is clear to auscultation without wheezing, rhonchi or crackles noted.  Heart: sounds are regular and normal without murmurs, rubs or gallops noted.   Abdomen: is soft, non-tender and non-distended with normal bowel sounds appreciated on auscultation.  Extremities: There is no pitting edema in the distal lower extremities bilaterally. She reports left shoulder pain but has full range of motion. She reports soreness of her left upper arm but there is no bruise. There is no swelling.  Skin: is warm and dry with no trophic changes noted. Age-related changes are noted on the skin.   Musculoskeletal: exam reveals no obvious joint deformities, tenderness or joint swelling or erythema.   Neurologically:  Mental status: The patient is awake and alert, paying fair attention. She is cannot really provide the history. Her daughter provides her Hx. She is oriented to: person. Her memory, attention, language and knowledge are impaired. There is no aphasia, agnosia, apraxia or anomia. There is a no signficant degree of bradyphrenia. Speech is not hypophonic with no dysarthria noted. Mood is congruent and affect seems blunted.   On 05/03/14: Her MMSE (Mini-Mental state exam): 20/30, CDT (Clock Drawing Test): 4/4 and AFT: 6/min. Geriatric Depression Scale Score: 4/15.   On 08/01/2015: MMSE: 13/30, CDT: 2/4, AFT: 3/min.  On 03/12/2016: MMSE: 15/30, CDT: 2/4, AFT: 3/min.  Cranial nerves are as  described above under HEENT exam. In addition, shoulder shrug is normal with equal shoulder height noted.  Motor exam: Normal bulk, and strength for age is noted. Tone is not rigid with absence of cogwheeling in the extremities. There is overall no bradykinesia. There is no drift or rebound. There is no tremor.  Romberg is negative. Reflexes are 1+ in the upper extremities and 1+ in the lower extremities. Fine motor skills: Finger taps, hand movements, and rapid alternating patting are not impaired bilaterally. Foot taps and foot  agility are not impaired bilaterally.   Cerebellar testing shows no dysmetria or intention tremor on finger to nose testing. Heel to shin is unremarkable. There is no truncal or gait ataxia.   Sensory exam is intact to light touch in the upper and lower extremities.   Gait, station and balance: She stands up from the seated position with no difficulty. No veering to one side is noted. No leaning to one side. Posture is age appropriate. Stance is narrow-based. She turns in 2 steps. Balance is very mildly impaired.   Assessment and Plan:   In summary, Amberli Ruegg is a very pleasant 75 year old female with an underlying history of depression, and borderline diabetes, who presents for follow-up consultation of her dementia without behavioral problems. She has had more problems sleeping at night and sleepiness during the day. Her primary care physician had provided the patient with a prescription for Zyprexa, first 5 mg as needed, now on 10 mg at night as needed. Daughter has not been using this night. Patient lives between one son's house and her daughter's house. She may get her citizenship in August. After that, she may travel to [place] again. Memory scores and exam are stable. We will continue with her dual medications, generic donepezil 10 mg once daily and memantine 10 mg twice daily. She has been on paxil low dose per PCP. She did not need a refill on her prescriptions. Her  memory loss has been progressive for the past 2+ years, maybe 3 years at this time. History and physical exam suggest late onset Alzheimer's disease without behavioral disturbance but she has some issues with depression for which she is on low-dose paroxetine her primary care physician. She had a brain MRI and labs which we have previously discussed. She requires supervision at this time. They have applied for her citizenship and her daughter has power of attorney as I understand.  I would like to see her back in 6 months, sooner if needed. I answered all her daughter's questions today and the patient and her daughter were in agreement.  I spent 25 minutes in total face-to-face time with the patient, more than 50% of which was spent in counseling and coordination of care, reviewing test results, reviewing medication and discussing or reviewing the diagnosis of AD, its prognosis and treatment options.

## 2016-03-12 NOTE — Patient Instructions (Signed)
We will continue with your memory medication.  You should try to walk every day.

## 2016-04-02 ENCOUNTER — Telehealth: Payer: Self-pay | Admitting: Neurology

## 2016-04-02 NOTE — Telephone Encounter (Signed)
Pt's daughter called to let our office know she will be dropping off paper work for her mother to be filled out. It is to go for pts mental status and if she needs to be deported. Please call daughter when filled out.

## 2016-04-04 NOTE — Telephone Encounter (Signed)
I just received the forms, ok to fill out?

## 2016-04-04 NOTE — Telephone Encounter (Signed)
I have not seen anything yet.

## 2016-04-04 NOTE — Telephone Encounter (Signed)
I spoke to Dr. Frances FurbishAthar about ppw. I called daughter, [name] back and advised that it would be best for her PCP to complete this ppw. Daughter voiced understanding. I will put ppw at front desk for p/u. I will also include office notes that daughter can submit with ppw.

## 2016-04-24 ENCOUNTER — Encounter: Payer: Self-pay | Admitting: Internal Medicine

## 2016-04-24 ENCOUNTER — Telehealth: Payer: Self-pay | Admitting: Internal Medicine

## 2016-04-24 NOTE — Telephone Encounter (Signed)
Family says patient cannot cooperate with citizenship testing due to memory loss. I would agree with that. Says more papers need to be completed for immigration/citizenship status. These were done today.

## 2016-04-25 DIAGNOSIS — Z029 Encounter for administrative examinations, unspecified: Secondary | ICD-10-CM

## 2016-08-26 ENCOUNTER — Other Ambulatory Visit: Payer: Medicare Other | Admitting: Internal Medicine

## 2016-08-26 DIAGNOSIS — Z0000 Encounter for general adult medical examination without abnormal findings: Secondary | ICD-10-CM

## 2016-08-26 DIAGNOSIS — R7302 Impaired glucose tolerance (oral): Secondary | ICD-10-CM | POA: Diagnosis not present

## 2016-08-26 DIAGNOSIS — Z13220 Encounter for screening for lipoid disorders: Secondary | ICD-10-CM | POA: Diagnosis not present

## 2016-08-26 LAB — COMPREHENSIVE METABOLIC PANEL
ALBUMIN: 4.1 g/dL (ref 3.6–5.1)
ALK PHOS: 90 U/L (ref 33–130)
ALT: 11 U/L (ref 6–29)
AST: 15 U/L (ref 10–35)
BILIRUBIN TOTAL: 0.7 mg/dL (ref 0.2–1.2)
BUN: 16 mg/dL (ref 7–25)
CHLORIDE: 105 mmol/L (ref 98–110)
CO2: 24 mmol/L (ref 20–31)
CREATININE: 0.7 mg/dL (ref 0.60–0.93)
Calcium: 9.7 mg/dL (ref 8.6–10.4)
Glucose, Bld: 90 mg/dL (ref 65–99)
Potassium: 4.2 mmol/L (ref 3.5–5.3)
Sodium: 142 mmol/L (ref 135–146)
TOTAL PROTEIN: 7.1 g/dL (ref 6.1–8.1)

## 2016-08-26 LAB — CBC WITH DIFFERENTIAL/PLATELET
Basophils Absolute: 62 cells/uL (ref 0–200)
Basophils Relative: 1 %
EOS PCT: 1 %
Eosinophils Absolute: 62 cells/uL (ref 15–500)
HCT: 41.5 % (ref 35.0–45.0)
HEMOGLOBIN: 14.1 g/dL (ref 11.7–15.5)
LYMPHS ABS: 1984 {cells}/uL (ref 850–3900)
Lymphocytes Relative: 32 %
MCH: 29.6 pg (ref 27.0–33.0)
MCHC: 34 g/dL (ref 32.0–36.0)
MCV: 87 fL (ref 80.0–100.0)
MONOS PCT: 8 %
MPV: 9.5 fL (ref 7.5–12.5)
Monocytes Absolute: 496 cells/uL (ref 200–950)
NEUTROS ABS: 3596 {cells}/uL (ref 1500–7800)
NEUTROS PCT: 58 %
PLATELETS: 279 10*3/uL (ref 140–400)
RBC: 4.77 MIL/uL (ref 3.80–5.10)
RDW: 13.8 % (ref 11.0–15.0)
WBC: 6.2 10*3/uL (ref 3.8–10.8)

## 2016-08-26 LAB — LIPID PANEL
CHOLESTEROL: 205 mg/dL — AB (ref <200)
HDL: 62 mg/dL (ref >50)
LDL Cholesterol: 131 mg/dL — ABNORMAL HIGH (ref <100)
TRIGLYCERIDES: 62 mg/dL (ref <150)
Total CHOL/HDL Ratio: 3.3 Ratio (ref <5.0)
VLDL: 12 mg/dL (ref <30)

## 2016-08-26 LAB — HEMOGLOBIN A1C
Hgb A1c MFr Bld: 5.8 % — ABNORMAL HIGH (ref <5.7)
Mean Plasma Glucose: 120 mg/dL

## 2016-08-27 ENCOUNTER — Ambulatory Visit (INDEPENDENT_AMBULATORY_CARE_PROVIDER_SITE_OTHER): Payer: Medicare Other | Admitting: Internal Medicine

## 2016-08-27 ENCOUNTER — Encounter: Payer: Self-pay | Admitting: Internal Medicine

## 2016-08-27 VITALS — BP 100/80 | HR 68 | Ht 60.0 in | Wt 124.0 lb

## 2016-08-27 DIAGNOSIS — Z0000 Encounter for general adult medical examination without abnormal findings: Secondary | ICD-10-CM | POA: Diagnosis not present

## 2016-08-27 DIAGNOSIS — E7800 Pure hypercholesterolemia, unspecified: Secondary | ICD-10-CM

## 2016-08-27 DIAGNOSIS — J069 Acute upper respiratory infection, unspecified: Secondary | ICD-10-CM

## 2016-08-27 DIAGNOSIS — R413 Other amnesia: Secondary | ICD-10-CM | POA: Diagnosis not present

## 2016-08-27 DIAGNOSIS — R7302 Impaired glucose tolerance (oral): Secondary | ICD-10-CM | POA: Diagnosis not present

## 2016-08-27 LAB — POC URINALSYSI DIPSTICK (AUTOMATED)
Leukocytes, UA: NEGATIVE
SPEC GRAV UA: 1.015
Urobilinogen, UA: NEGATIVE
pH, UA: 7

## 2016-08-27 MED ORDER — AMOXICILLIN 500 MG PO CAPS: 500.0000 mg | ORAL_CAPSULE | Freq: Three times a day (TID) | ORAL | 0 refills | Status: DC

## 2016-08-27 NOTE — Progress Notes (Signed)
Subjective:    Patient ID: Amber Mcgrath, female    DOB: [birth year], 76 y.o.   MRN: [id number]  HPI 76 year old [place] Female in today for health maintenance exam and evaluation of medical issues. Main issue is profound memory loss which is treated by a Neurologist. She has upcoming appointment with neurologist in February. Patient not motivated to do much of anything. She is on Namenda and Aricept. I have prescribes Zyprexa for her in the past for restlessness. She is also on Paxil for possible depression.  She had an MRI of the brain in 2015 with no acute intracranial infarct being noted. TSH and B-12 levels were normal at that time.  She presented here for the first time in September 2015 and was diagnosed with memory issues at that time.  No history of serious illnesses accidents or operations. No known drug allergies. She does not smoke or consume alcohol. She has a total of 6 children. Spends time with various children but daughter, [name], who is a Advertising account plannernail technician seems to be responsible for patient's medications and business affairs.  Patient's husband is deceased. He died probably sometime in 2015 of consultation of a stroke. After his death, family members began to notice issues with her memory.  Family history: Father died at age 77 of unknown causes. Mother living in her 1690s. Father was possibly an alcoholic with depression. One living brother. Other siblings deceased. No sisters. Brothers died of war injuries or other unknown causes.  We have no medical records to review from [place] or elsewhere prior to her coming to this office   Review of Systems has developed recent URI with cough. Other family members sick with respiratory infections.     Objective:   Physical Exam  Constitutional: She is oriented to person, place, and time. She appears well-developed and well-nourished. No distress.  HENT:  Head: Normocephalic and atraumatic.  Right Ear: External ear normal.  Left  Ear: External ear normal.  Mouth/Throat: Oropharynx is clear and moist.  Eyes: Conjunctivae and EOM are normal. Pupils are equal, round, and reactive to light. Right eye exhibits no discharge. Left eye exhibits no discharge. No scleral icterus.  Neck: Neck supple. No JVD present. No thyromegaly present.  Cardiovascular: Normal rate, regular rhythm, normal heart sounds and intact distal pulses.   No murmur heard. Pulmonary/Chest: No respiratory distress. She has no wheezes. She has no rales. She exhibits no tenderness.  Breasts normal female  Abdominal: Soft. Bowel sounds are normal. She exhibits no distension and no mass. There is no tenderness. There is no rebound and no guarding.  Genitourinary:  Genitourinary Comments: Deferred  Musculoskeletal: She exhibits no edema.  Lymphadenopathy:    She has no cervical adenopathy.  Neurological: She is alert and oriented to person, place, and time. She has normal reflexes. No cranial nerve deficit. Coordination normal.  Skin: Skin is warm and dry. She is not diaphoretic.  Psychiatric: She has a normal mood and affect. Her behavior is normal.  Affect is flat. She is cooperative. Not exhibiting any behavioral disturbance today. Daughter speaks [place] fluently inpatient does acknowledge what daughter is saying and cooperates  Vitals reviewed.         Assessment & Plan:  Advancing dementia  Acute URI  Impaired glucose tolerance-hemoglobin A1c 5.8%. No treatment continue to monitor.  Elevated LDL cholesterol at 131. This will not be treated  Plan: Continue same medications and return in one year or as needed. For respiratory infection given amoxicillin 500  mg 3 times daily for 10 days.

## 2016-09-16 ENCOUNTER — Ambulatory Visit: Payer: Medicare Other | Admitting: Neurology

## 2016-10-02 ENCOUNTER — Ambulatory Visit (INDEPENDENT_AMBULATORY_CARE_PROVIDER_SITE_OTHER): Payer: Medicare Other | Admitting: Neurology

## 2016-10-02 ENCOUNTER — Encounter: Payer: Self-pay | Admitting: Neurology

## 2016-10-02 VITALS — BP 152/81 | HR 62 | Resp 20 | Ht 60.0 in | Wt 126.0 lb

## 2016-10-02 DIAGNOSIS — G301 Alzheimer's disease with late onset: Secondary | ICD-10-CM | POA: Diagnosis not present

## 2016-10-02 DIAGNOSIS — F0280 Dementia in other diseases classified elsewhere without behavioral disturbance: Secondary | ICD-10-CM | POA: Diagnosis not present

## 2016-10-02 NOTE — Progress Notes (Signed)
Subjective:    Patient ID: Amber Mcgrath is a 76 y.o. female.  HPI     Interim history:   Amber Mcgrath is a very pleasant 76 year old right-handed woman with an underlying history of depression, borderline diabetes, who presents for follow-up consultation of her dementia. The patient is accompanied by her youngest daughter, Nhung Stanton Kidney") again today and they declined an interpreter, as patient is less vocal and less comfortable with a stranger, per daughter and daughter's English is good. Of note, she missed an appointment for 09/16/2016. I last saw her on 03/12/2016, at which time she was doing reasonably well. Her daughter hours keeping her for about 3 weeks but she also stayed with her son and his family. She was tolerating her medications. She had no recent illness. I encouraged her to walk more for exercise. We continued Namenda generic and Aricept generic. MMSE was 15 out of 30.  Today, 10/02/2016 (all dictated new, as well as above notes, some dictation done in note pad or Word, outside of chart, may appear as copied):  She reports very little, denies pain, denies feeling depressed, denies feeling anxious, denies any problems today. Her daughter provides her history. She has noted decline in her mother's memory function and there is lack of motivation to do anything but lay in the bed and sleep off and on during the day. At night, she is more restless and yells out, sometimes has a bad dream, sometimes gets confused at night. She stays at her daughter's house most times and some at her son's houses. Her oldest daughter lives in [place], she has 3 sons, then [place], then another younger son here. Appetite okay, weight stable. Does not like to watch TV, even the 10 [place] channels they have available, does not like to go on walks even though she has no joint pain and no difficulty walking.  The patient's allergies, current medications, family history, past medical history, past social history, past  surgical history and problem list were reviewed and updated as appropriate.   Previously (copied from previous notes for reference):    I saw her on 11/07/2015, at which time I suggested we continue with donepezil 10 mg daily and generic Namenda 10 mg twice daily. Daughter reported that patient had a recent upper respiratory infection. Daughter has been her main caretaker. Patient was noted to have some restlessness. She was in generic Paxil and primary care physician had tried her on low-dose Zyprexa, first 5 mg, then increase to 10 mg and daughter was using this as needed only. Daughter reported some minor falls, thankfully no injuries.   I saw her on 08/01/2015, at which time her MMSE was 13/30, CDT: 2/4, AFT: 3/min. her daughter reported that she had to move in with her. The patient was not able to live alone anymore. Her daughter felt overwhelmed as other siblings were not helping out as much. Her daughter was also working off-and-on at a [employer]. Her husband was traveling quite a bit. The patient fell a few days prior and had bruised her face. There was no loss of consciousness or serious injuries or symptoms at the time but she did have shoulder soreness on the left and a bruise on her face. They did not seek medical attention at the time. She did have a follow-up appointment with her primary care physician coming up. She was not able to walk as much. She had more confusion. The daughter requested a handicap sticker because it was getting more and  more difficult for the patient to walk and if dropped off at the main entrance of a store or doctor's office she had a tendency to wander off. Her daughter was also working on the patient's citizenship paperwork and will request information from my end of things regarding her memory loss. We talked about power of attorney and she was advised to seek legal advice for that. Her memory have become worse, she had more confusion and disorientation. She needed  more help with ADLs. Based on the history and her memory scores have suggested we start her on Namenda long-acting 7 mg once daily in addition to continuation with donepezil 10 mg daily.   Of note, the patient no showed for an appointment on 12/28/2014. I saw her on 08/29/2014, at which time the patient reported doing well. She had no new complaints. Her daughter reported that she was able to tolerate Aricept at 5 mg strength without any side effects noted. They were planning to have her go to [place] for about 2-3 months so she could be with extended family and friends. I suggested we increase the Aricept to 10 mg once daily. She was encouraged to drink more water.   I first met her on 05/03/2014 at the request of her primary care physician, at which time her daughter reported an approximately eight-month history of progressive memory loss. At the time of her first visit her MMSE was 20 out of 30. I suggested low-dose generic Aricept at 5 mg daily. She has previously had appropriate blood work and an MRI so I did not add any new test. I suggested she take a baby aspirin as opposed to an adult size aspirin daily as she did not have any history of stroke or TIA previously. She also had no history of heart disease.    She had a brain MRI with and without contrast on 04/22/2014: 1. No acute intracranial infarct or other abnormality identified. 2. 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 In addition, personally reviewed the images through the PACS system. Blood work from 04/12/2014 showed: normal B12 at 486, CBC with differential was normal, normal UA, CMP was normal with the exception of elevated blood sugar level at 218, A1c was 6.3, folate normal and TSH normal.   She primarily has been having difficulty with short-term memory such as forgetfulness, misplacing things, asking the same question again and forgetting dates and events. There is no report of  confusion or disorientation. Familiar faces are easily recognized.   She reports no recurrent headaches. She does not drive. She does not smoke or drink alcohol.   There is no report of Auditory Hallucinations and Visual Hallucinations and there are no delusions, such as paranoia.   She has not been on any dementia medications.    The patient denies prior TIA or stroke symptoms, such as sudden onset of one sided weakness, numbness, tingling, slurring of speech or droopy face, hearing loss, tinnitus, diplopia or visual field cut or monocular loss of vision, and denies recurrent headaches.   Of note, the patient denies snoring, and there is no report of witnessed apneas or choking sensations while asleep. She does not drive.    Her Past Medical History Is Significant For: No past medical history on file.  Her Past Surgical History Is Significant For: No past surgical history on file.  Her Family History Is Significant For: Family History  Problem Relation Age of Onset  . Depression Father  Her Social History Is Significant For: Social History   Social History  . Marital status: Widowed    Spouse name: N/A  . Number of children: 6  . Years of education: 4   Occupational History  .      not employed   Social History Main Topics  . Smoking status: Never Smoker  . Smokeless tobacco: Never Used  . Alcohol use No  . Drug use: No  . Sexual activity: Not Asked   Other Topics Concern  . None   Social History Narrative   Patient is right handed and resides with children    Her Allergies Are:  No Known Allergies:   Her Current Medications Are:  Outpatient Encounter Prescriptions as of 10/02/2016  Medication Sig  . amoxicillin (AMOXIL) 500 MG capsule Take 1 capsule (500 mg total) by mouth 3 (three) times daily.  Marland Kitchen aspirin 325 MG tablet Take 81 mg by mouth daily.   Marland Kitchen donepezil (ARICEPT) 10 MG tablet Take 1 tablet (10 mg total) by mouth at bedtime.  . memantine (NAMENDA) 10  MG tablet Take 1 tablets twice daily  . Multiple Vitamin (MULTIVITAMIN) capsule Take 1 capsule by mouth daily.  Marland Kitchen OLANZapine (ZYPREXA) 10 MG tablet Take 1 tablet (10 mg total) by mouth at bedtime.  . Omega-3 Fatty Acids (FISH OIL PO) Take by mouth.  Marland Kitchen PARoxetine (PAXIL) 10 MG tablet Take 1 tablet (10 mg total) by mouth daily.   No facility-administered encounter medications on file as of 10/02/2016.   : Review of Systems:  Out of a complete 14 point review of systems, all are reviewed and negative with the exception of these symptoms as listed below:  Review of Systems  Neurological:       Pt's daughter says that pt's memory has gotten worse.    Objective:  Neurologic Exam  Physical Exam Physical Examination:   Vitals:   10/02/16 1036  BP: (!) 152/81  Pulse: 62  Resp: 20   General Examination: The patient is a very pleasant 76 y.o. female in no acute distress. She is quiet, calm, cooperative, minimally verbal, well groomed.   HEENT: Normocephalic, atraumatic, pupils are equal, round and reactive to light and accommodation. She has mild difficulty with tracking. Face is symmetric. She has no neck rigidity, oropharynx exam shows mild mouth dryness, no significant airway crowding, tongue protrudes centrally and palate elevates symmetrically.   Chest: Clear to auscultation without wheezing, rhonchi or crackles noted.  Heart: S1+S2+0, regular and normal without murmurs, rubs or gallops noted.   Abdomen: Soft, non-tender and non-distended with normal bowel sounds appreciated on auscultation.  Extremities: There is no pitting edema in the distal lower extremities bilaterally. Pedal pulses are intact.  Skin: Warm and dry without trophic changes noted.  Musculoskeletal: exam reveals no obvious joint deformities, tenderness or joint swelling or erythema. Mild right knee swelling is noted that she has no tenderness, no redness nor warmth.  Neurologically:  Mental status: The patient  is awake, paying is some attention, she is not fully oriented. Her memory, language, attention and fund of knowledge are normal, of course there is the language barrier as well.  Her affect appears to be blunted.  On 05/03/14: Her MMSE (Mini-Mental state exam): 20/30, CDT (Clock Drawing Test): 4/4 and AFT: 6/min. Geriatric Depression Scale Score: 4/15.   On 08/01/2015: MMSE: 13/30, CDT: 2/4, AFT: 3/min.  On 03/12/2016: MMSE: 15/30, CDT: 2/4, AFT: 3/min.  On 10/02/2016: MMSE: 9/30, CDT: 1/4, AFT: 1/min.  Cranial nerves II - XII are as described above under HEENT exam. In addition: shoulder shrug is normal with equal shoulder height noted. Motor exam: Normal bulk, strength and tone is noted. There is no drift, tremor or rebound. Romberg is negative. Romberg is negative. Fine motor skills and coordination: intact and appropriate for age.  Cerebellar testing: No dysmetria or intention tremor.  Sensory exam: intact to light touch in the upper and lower extremities.  Gait, station and balance: She stands easily. Posture is age-appropriate, she stands slightly wide-based, she walks without difficulty and denies any pain, balance is preserved.                Assessment and Plan:   In summary, Sharlyn Odonnel is a very pleasant 76 y.o.-year old female withAn underlying medical history of prediabetes of borderline diabetes, and depression, who presents for follow-up visit of her advancing dementia without behavioral problems. Her memory scores have declined with time. She has been on generic Aricept and generic Namenda. Her daughter has noted a decline in her memory function. Unfortunately there is not a whole lot that can be done at this time. MRI brain in the past had shown global atrophy and white matter changes. She also had blood work in the past. They are in the process of getting her citizenship and hopefully after that she can travel to [place] and stay with her other family there. She has her oldest  daughter there. Her youngest daughter has been exemplary in her care and has always been here for her appointments. In addition, she has 4 sons. She has good support network but most of the time stays with her daughter and her family. Sometimes this causes friction at her daughter's house as she also works and has 3 boys. She did not need any prescription refills today. Patient spends a long time in bed typically, she sleeps off and on during the day. Daughter is encouraged to try to encourage patient to walk around the house or outside if the weather permits. She is also not very interested in watching TV programs, they have multiple [place] channels available but she is not interested. She has been on low-dose Paxil and I suggested they continue with that.  I suggested a six-month follow-up for recheck. We talked about good nutrition, good hydration and trying to increase mental stimulation and physical activity. I answered all her questions today and the patient and her daughter were in agreement.I spent 20 minutes in total face-to-face time with the patient, more than 50% of which was spent in counseling and coordination of care, reviewing test results, reviewing medication and discussing or reviewing the diagnosis of dementia, its prognosis and treatment options. Pertinent laboratory and imaging test results that were available during this visit with the patient were reviewed by me and considered in my medical decision making (see chart for details).

## 2016-10-02 NOTE — Patient Instructions (Signed)
Please try to spend less time in bed and try to walk around more during the day. We will continue with your memory medications, generic Aricept 10 mg once daily and generic Namenda 10 mg twice daily. Please make a follow-up appointment in 6 months from now, I understand that you will call back to make the appointment.

## 2016-10-02 NOTE — Progress Notes (Signed)
Pt and daughter refused translator service and signed the waiver to free interpreter services. Sent to MR to be scanned into EPIC.

## 2016-10-08 NOTE — Patient Instructions (Signed)
Take amoxicillin for respiratory infection. Continue same medications and return in one year or as needed.

## 2017-01-21 ENCOUNTER — Encounter: Payer: Self-pay | Admitting: Internal Medicine

## 2017-01-21 ENCOUNTER — Ambulatory Visit (INDEPENDENT_AMBULATORY_CARE_PROVIDER_SITE_OTHER): Payer: Medicare Other | Admitting: Internal Medicine

## 2017-01-21 VITALS — BP 118/68 | HR 69 | Temp 97.2°F | Wt 130.0 lb

## 2017-01-21 DIAGNOSIS — K0889 Other specified disorders of teeth and supporting structures: Secondary | ICD-10-CM

## 2017-01-21 DIAGNOSIS — K120 Recurrent oral aphthae: Secondary | ICD-10-CM

## 2017-01-21 DIAGNOSIS — K047 Periapical abscess without sinus: Secondary | ICD-10-CM | POA: Diagnosis not present

## 2017-01-21 DIAGNOSIS — R413 Other amnesia: Secondary | ICD-10-CM

## 2017-01-21 MED ORDER — PENICILLIN V POTASSIUM 500 MG PO TABS: 500.0000 mg | ORAL_TABLET | Freq: Four times a day (QID) | ORAL | 0 refills | Status: AC

## 2017-01-21 NOTE — Progress Notes (Signed)
   Subjective:    Patient ID: Amber Mcgrath, female    DOB: [birth year], 76 y.o.   MRN: [id number]  HPI Patient has history of late onset Alzheimer's disease with behavioral disturbance followed by neurology. She is accompanied today by her daughter. They have noticed some facial swelling particular around her upper lip. She has a loose left central incisor. Apparently this started on Sunday, June 10. They're not aware of any foods that caused her lips to swell.  She does not have a dentist. She has Medicaid and we will need to find someone who accepts that. We have searched on the Internet and think we have a couple of names we can provide to her daughter.  Review of Systems     Objective:   Physical Exam She has no angioedema of her lip. There is an apthous ulcer maxillary gum area above the loose tooth. The tooth is fairly loose when I tap on it it seems to be painful to her. She does not speak [place].       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 The tooth is loose.  This ulcer maxillary gum area above loose tooth  Do not see angioedema of the lip  Plan: Pen-Vee K 500 mg 4 times a day for 7 days. Names of dentist provided to daughter.

## 2017-01-21 NOTE — Patient Instructions (Addendum)
Pen-Vee K 500 mg 4 times a day for 7 days. Needs to see dentist assessment is possible. Tylenol or Advil for pain.

## 2017-01-23 ENCOUNTER — Other Ambulatory Visit: Payer: Self-pay | Admitting: Neurology

## 2017-01-23 DIAGNOSIS — F0280 Dementia in other diseases classified elsewhere without behavioral disturbance: Secondary | ICD-10-CM

## 2017-01-23 DIAGNOSIS — F0390 Unspecified dementia without behavioral disturbance: Secondary | ICD-10-CM

## 2017-01-23 DIAGNOSIS — G301 Alzheimer's disease with late onset: Secondary | ICD-10-CM

## 2017-01-24 ENCOUNTER — Encounter: Payer: Self-pay | Admitting: *Deleted

## 2017-07-08 ENCOUNTER — Other Ambulatory Visit: Payer: Self-pay | Admitting: Internal Medicine

## 2018-02-18 ENCOUNTER — Other Ambulatory Visit: Payer: Self-pay | Admitting: Neurology

## 2018-02-18 DIAGNOSIS — G301 Alzheimer's disease with late onset: Secondary | ICD-10-CM

## 2018-02-18 DIAGNOSIS — F0390 Unspecified dementia without behavioral disturbance: Secondary | ICD-10-CM

## 2018-02-18 DIAGNOSIS — F0280 Dementia in other diseases classified elsewhere without behavioral disturbance: Secondary | ICD-10-CM

## 2018-03-04 ENCOUNTER — Other Ambulatory Visit: Payer: Self-pay | Admitting: Neurology

## 2018-03-04 DIAGNOSIS — G301 Alzheimer's disease with late onset: Secondary | ICD-10-CM

## 2018-03-04 DIAGNOSIS — F0390 Unspecified dementia without behavioral disturbance: Secondary | ICD-10-CM

## 2018-03-04 DIAGNOSIS — F0280 Dementia in other diseases classified elsewhere without behavioral disturbance: Secondary | ICD-10-CM

## 2018-03-05 NOTE — Telephone Encounter (Signed)
Received a refill request for pt's aricept. Pt needs an appt for further refills since she has not been seen in over one year. I called pt's daughter, [name], per DPR, to discuss. No answer, left a message asking her to call me back.

## 2018-03-06 ENCOUNTER — Other Ambulatory Visit: Payer: Self-pay | Admitting: Neurology

## 2018-03-06 DIAGNOSIS — F0280 Dementia in other diseases classified elsewhere without behavioral disturbance: Secondary | ICD-10-CM

## 2018-03-06 DIAGNOSIS — G301 Alzheimer's disease with late onset: Secondary | ICD-10-CM

## 2018-03-06 DIAGNOSIS — F0390 Unspecified dementia without behavioral disturbance: Secondary | ICD-10-CM

## 2018-03-30 ENCOUNTER — Other Ambulatory Visit: Payer: Self-pay | Admitting: Neurology

## 2018-04-30 ENCOUNTER — Telehealth: Payer: Self-pay

## 2018-04-30 NOTE — Telephone Encounter (Signed)
Called patient's daughter to schedule her CPE she said patient is still in [place] and she will call back once she's back in the country.

## 2018-05-31 ENCOUNTER — Other Ambulatory Visit: Payer: Self-pay | Admitting: Neurology

## 2018-05-31 DIAGNOSIS — F0280 Dementia in other diseases classified elsewhere without behavioral disturbance: Secondary | ICD-10-CM

## 2018-05-31 DIAGNOSIS — F0390 Unspecified dementia without behavioral disturbance: Secondary | ICD-10-CM

## 2018-05-31 DIAGNOSIS — G301 Alzheimer's disease with late onset: Secondary | ICD-10-CM

## 2018-06-03 ENCOUNTER — Other Ambulatory Visit: Payer: Self-pay | Admitting: Internal Medicine

## 2018-06-03 NOTE — Telephone Encounter (Signed)
Araceli's note says pt was in [place] and would not schedule CPE until returns. That was mid September. Do they even need this med refilled? Last CPE was 2018.

## 2018-07-14 ENCOUNTER — Other Ambulatory Visit: Payer: Self-pay | Admitting: Neurology

## 2018-07-14 DIAGNOSIS — F0390 Unspecified dementia without behavioral disturbance: Secondary | ICD-10-CM

## 2018-07-14 DIAGNOSIS — G301 Alzheimer's disease with late onset: Secondary | ICD-10-CM

## 2018-07-14 DIAGNOSIS — F0280 Dementia in other diseases classified elsewhere without behavioral disturbance: Secondary | ICD-10-CM

## 2018-07-15 NOTE — Telephone Encounter (Signed)
I called pt's daughter, [name], per DPR. She reports that pt is in [place] and doesn't plan on coming back. Pt's daughter understands that pt needs an appt with Dr. Frances FurbishAthar to continue getting refills. Pt's daughter will call us to schedule an appt if pt decides to return to the [place].

## 2018-11-25 ENCOUNTER — Telehealth: Payer: Self-pay | Admitting: Internal Medicine

## 2018-11-25 ENCOUNTER — Encounter: Payer: Self-pay | Admitting: Internal Medicine

## 2018-11-25 NOTE — Telephone Encounter (Signed)
Attempted to call and LVM, however mail box was full.  Mailed letter to [facility] and schedule CPE

## 2019-03-11 ENCOUNTER — Other Ambulatory Visit: Payer: Self-pay
